# Patient Record
Sex: Female | Born: 1990 | Race: Black or African American | Hispanic: No | Marital: Single | Smoking: Never smoker
Health system: Southern US, Community
[De-identification: ages and names within clinical notes are randomized; demographics above are authoritative.]

## PROBLEM LIST (undated history)

## (undated) ENCOUNTER — Inpatient Hospital Stay (HOSPITAL_COMMUNITY): Payer: Self-pay

## (undated) DIAGNOSIS — K5190 Ulcerative colitis, unspecified, without complications: Secondary | ICD-10-CM

## (undated) DIAGNOSIS — O24419 Gestational diabetes mellitus in pregnancy, unspecified control: Secondary | ICD-10-CM

## (undated) HISTORY — PX: NO PAST SURGERIES: SHX2092

## (undated) HISTORY — DX: Gestational diabetes mellitus in pregnancy, unspecified control: O24.419

---
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
## 2010-05-13 LAB — POCT URINALYSIS DIPSTICK
Protein, ur: 30 mg/dL — AB
Urobilinogen, UA: 0.2 mg/dL (ref 0.0–1.0)

## 2010-05-13 LAB — POCT PREGNANCY, URINE: Preg Test, Ur: NEGATIVE

## 2010-11-08 ENCOUNTER — Emergency Department (HOSPITAL_COMMUNITY)
Admission: EM | Admit: 2010-11-08 | Discharge: 2010-11-08 | Disposition: A | Discharge status: Home / Self Care | Payer: BC Managed Care – PPO | Attending: Emergency Medicine | Admitting: Emergency Medicine

## 2010-11-08 ENCOUNTER — Emergency Department (HOSPITAL_COMMUNITY): Payer: BC Managed Care – PPO

## 2010-11-08 DIAGNOSIS — K5900 Constipation, unspecified: Secondary | ICD-10-CM | POA: Insufficient documentation

## 2010-11-08 DIAGNOSIS — R109 Unspecified abdominal pain: Secondary | ICD-10-CM | POA: Insufficient documentation

## 2010-11-08 DIAGNOSIS — N72 Inflammatory disease of cervix uteri: Secondary | ICD-10-CM | POA: Insufficient documentation

## 2010-11-08 DIAGNOSIS — B9689 Other specified bacterial agents as the cause of diseases classified elsewhere: Secondary | ICD-10-CM | POA: Insufficient documentation

## 2010-11-08 DIAGNOSIS — R112 Nausea with vomiting, unspecified: Secondary | ICD-10-CM | POA: Insufficient documentation

## 2010-11-08 DIAGNOSIS — N760 Acute vaginitis: Secondary | ICD-10-CM | POA: Insufficient documentation

## 2010-11-08 DIAGNOSIS — A499 Bacterial infection, unspecified: Secondary | ICD-10-CM | POA: Insufficient documentation

## 2010-11-08 LAB — DIFFERENTIAL
Basophils Absolute: 0 10*3/uL (ref 0.0–0.1)
Basophils Relative: 0 % (ref 0–1)
Eosinophils Absolute: 0.1 10*3/uL (ref 0.0–0.7)
Eosinophils Relative: 2 % (ref 0–5)

## 2010-11-08 LAB — URINALYSIS, ROUTINE W REFLEX MICROSCOPIC
Bilirubin Urine: NEGATIVE
Nitrite: NEGATIVE
Specific Gravity, Urine: 1.01 (ref 1.005–1.030)
Urobilinogen, UA: 0.2 mg/dL (ref 0.0–1.0)

## 2010-11-08 LAB — URINE MICROSCOPIC-ADD ON

## 2010-11-08 LAB — BASIC METABOLIC PANEL
Chloride: 104 mEq/L (ref 96–112)
Creatinine, Ser: 1.36 mg/dL — ABNORMAL HIGH (ref 0.50–1.10)
GFR calc Af Amer: 60 mL/min — ABNORMAL LOW (ref >60)
Potassium: 3.5 mEq/L (ref 3.5–5.1)

## 2010-11-08 LAB — CBC
Platelets: 248 10*3/uL (ref 150–400)
RDW: 13.2 % (ref 11.5–15.5)
WBC: 7.1 10*3/uL (ref 4.0–10.5)

## 2010-11-08 LAB — POCT PREGNANCY, URINE: Preg Test, Ur: NEGATIVE

## 2010-11-08 LAB — WET PREP, GENITAL: Trich, Wet Prep: NONE SEEN

## 2010-11-08 MED ORDER — IOHEXOL 300 MG/ML  SOLN
100.0000 mL | Freq: Once | INTRAMUSCULAR | Status: AC | PRN
  Administered 2010-11-08: 100 mL via INTRAVENOUS

## 2010-11-09 LAB — GC/CHLAMYDIA PROBE AMP, GENITAL
Chlamydia, DNA Probe: NEGATIVE
GC Probe Amp, Genital: NEGATIVE

## 2011-05-12 ENCOUNTER — Emergency Department (INDEPENDENT_AMBULATORY_CARE_PROVIDER_SITE_OTHER)
Admission: EM | Admit: 2011-05-12 | Discharge: 2011-05-12 | Disposition: A | Discharge status: Home / Self Care | Payer: BC Managed Care – PPO | Source: Home / Self Care | Attending: Emergency Medicine | Admitting: Emergency Medicine

## 2011-05-12 ENCOUNTER — Encounter (HOSPITAL_COMMUNITY): Payer: Self-pay | Admitting: *Deleted

## 2011-05-12 DIAGNOSIS — N938 Other specified abnormal uterine and vaginal bleeding: Secondary | ICD-10-CM

## 2011-05-12 DIAGNOSIS — N926 Irregular menstruation, unspecified: Secondary | ICD-10-CM

## 2011-05-12 DIAGNOSIS — N939 Abnormal uterine and vaginal bleeding, unspecified: Secondary | ICD-10-CM

## 2011-05-12 LAB — POCT URINALYSIS DIP (DEVICE)
Glucose, UA: NEGATIVE mg/dL
Nitrite: NEGATIVE
Protein, ur: 30 mg/dL — AB
Urobilinogen, UA: 1 mg/dL (ref 0.0–1.0)
pH: 6.5 (ref 5.0–8.0)

## 2011-05-12 MED ORDER — MEDROXYPROGESTERONE ACETATE 5 MG PO TABS: 5.0000 mg | ORAL_TABLET | Freq: Every day | ORAL | Status: DC

## 2011-05-12 MED ORDER — NORGESTIM-ETH ESTRAD TRIPHASIC 0.18/0.215/0.25 MG-35 MCG PO TABS: 1.0000 | ORAL_TABLET | Freq: Every day | ORAL | Status: DC

## 2011-05-12 MED ORDER — NAPROXEN 375 MG PO TABS: 375.0000 mg | ORAL_TABLET | Freq: Two times a day (BID) | ORAL | Status: DC

## 2011-05-12 NOTE — Discharge Instructions (Signed)
Abnormal Uterine Bleeding Abnormal uterine bleeding can have many causes. Some cases are simply treated, while others are more serious. There are several kinds of bleeding that is considered abnormal, including:  Bleeding between periods.   Bleeding after sexual intercourse.   Spotting anytime in the menstrual cycle.   Bleeding heavier or more than normal.   Bleeding after menopause.  CAUSES  There are many causes of abnormal uterine bleeding. It can be present in teenagers, pregnant women, women during their reproductive years, and women who have reached menopause. Your caregiver will look for the more common causes depending on your age, signs, symptoms and your particular circumstance. Most cases are not serious and can be treated. Even the more serious causes, like cancer of the female organs, can be treated adequately if found in the early stages. That is why all types of bleeding should be evaluated and treated as soon as possible. DIAGNOSIS  Diagnosing the cause may take several kinds of tests. Your caregiver may:  Take a complete history of the type of bleeding.   Perform a complete physical exam and Pap smear.   Take an ultrasound on the abdomen showing a picture of the female organs and the pelvis.   Inject dye into the uterus and Fallopian tubes and X-ray them (hysterosalpingogram).   Place fluid in the uterus and do an ultrasound (sonohysterogrqphy).   Take a CT scan to examine the female organs and pelvis.   Take an MRI to examine the female organs and pelvis. There is no X-ray involved with this procedure.   Look inside the uterus with a telescope that has a light at the end (hysteroscopy).   Scrap the inside of the uterus to get tissue to examine (Dilatation and Curettage, D&C).   Look into the pelvis with a telescope that has a light at the end (laparoscopy). This is done through a very small cut (incision) in the abdomen.  TREATMENT  Treatment will depend on the  cause of the abnormal bleeding. It can include:  Doing nothing to allow the problem to take care of itself over time.   Hormone treatment.   Birth control pills.   Treating the medical condition causing the problem.   Laparoscopy.   Major or minor surgery   Destroying the lining of the uterus with electrical currant, laser, freezing or heat (uterine ablation).  HOME CARE INSTRUCTIONS   Follow your caregiver's recommendation on how to treat your problem.   See your caregiver if you missed a menstrual period and think you may be pregnant.   If you are bleeding heavily, count the number of pads/tampons you use and how often you have to change them. Tell this to your caregiver.   Avoid sexual intercourse until the problem is controlled.  SEEK MEDICAL CARE IF:   You have any kind of abnormal bleeding mentioned above.   You feel dizzy at times.   You are 21 years old and have not had a menstrual period yet.  SEEK IMMEDIATE MEDICAL CARE IF:   You pass out.   You are changing pads/tampons every 15 to 30 minutes.   You have belly (abdominal) pain.   You have a temperature of 100 F (37.8 C) or higher.   You become sweaty or weak.   You are passing large blood clots from the vagina.   You start to feel sick to your stomach (nauseous) and throw up (vomit).  Document Released: 02/15/2005 Document Revised: 02/04/2011 Document Reviewed: 07/11/2008 [employer]   Patient Information 2012 [employer]. 

## 2011-05-12 NOTE — ED Provider Notes (Signed)
Chief Complaint  Patient presents with  . Vaginal Bleeding    History of Present Illness:   The patient is a 21 year old female who has had a two-week history of abnormal menses. She switched from Avianne birth control pills in January 2 a new prescription control pill called Leilani Merl. Her last menstrual period began on March 4 and it was a week or early. It lasted 4 days and was normal in amount of flow but with some cramping. She denies any passage of clots or tissue. She stopped from the eighth to the 11th then again started on the 11th, 2 days ago. She is still bleeding right now, but has less flow than a normal menstrual period. She does have more severe cramping. She denies any passage of clots or tissue. She has had no fever, chills, nausea, or vomiting, but does note some anorexia. She sees a gynecologist in [place]. Does not have a gynecologist here.  Review of Systems:  Other than noted above, the patient denies any of the following symptoms: Systemic:  No fever, chills, sweats, fatigue, or weight loss. GI:  No abdominal pain, nausea, anorexia, vomiting, diarrhea, constipation, melena or hematochezia. GU:  No dysuria, frequency, urgency, hematuria, vaginal discharge, itching, or abnormal vaginal bleeding. Skin:  No rash or itching.   PMFSH:  Past medical history, family history, social history, meds, and allergies were reviewed.  Physical Exam:   Vital signs:  BP 110/71  Pulse 68  Temp(Src) 98.3 F (36.8 C) (Oral)  Resp 18  SpO2 96%  LMP 05/07/2011 General:  Alert, oriented and in no distress. Lungs:  Breath sounds clear and equal bilaterally.  No wheezes, rales or rhonchi. Heart:  Regular rhythm.  No gallops or murmers. Abdomen:  Soft, flat and non-distended.  No organomegaly or mass.  No tenderness, guarding or rebound.  Bowel sounds normally active. Pelvic exam:  Normal external genitalia. There is a moderate amount of blood and clots in the vaginal vault. Cervix appears  normal and closed. There is no cervical motion tenderness. Uterus is mid position, normal in size and shape and nontender. She has mild bilateral adnexal tenderness but no mass. Skin:  Clear, warm and dry.  Labs:   Results for orders placed during the hospital encounter of 05/12/11  POCT URINALYSIS DIP (DEVICE)      Component Value Range   Glucose, UA NEGATIVE  NEGATIVE (mg/dL)   Bilirubin Urine SMALL (*) NEGATIVE    Ketones, ur >=160 (*) NEGATIVE (mg/dL)   Specific Gravity, Urine 1.025  1.005 - 1.030    Hgb urine dipstick LARGE (*) NEGATIVE    pH 6.5  5.0 - 8.0    Protein, ur 30 (*) NEGATIVE (mg/dL)   Urobilinogen, UA 1.0  0.0 - 1.0 (mg/dL)   Nitrite NEGATIVE  NEGATIVE    Leukocytes, UA NEGATIVE  NEGATIVE   POCT PREGNANCY, URINE      Component Value Range   Preg Test, Ur NEGATIVE  NEGATIVE      Assessment:   Diagnoses that have been ruled out:  None  Diagnoses that are still under consideration:  None  Final diagnoses:  Dysfunctional uterine bleeding - possibly due to change in her birth control pills.     Plan:   1.  The following meds were prescribed:   New Prescriptions   MEDROXYPROGESTERONE (PROVERA) 5 MG TABLET    Take 1 tablet (5 mg total) by mouth daily.   NAPROXEN (NAPROSYN) 375 MG TABLET    Take 1  tablet (375 mg total) by mouth 2 (two) times daily.   NORGESTIMATE-ETHINYL ESTRADIOL TRIPHASIC (TRI-SPRINTEC) 0.18/0.215/0.25 MG-35 MCG TABLET    Take 1 tablet by mouth daily.   2.  The patient was instructed in symptomatic care and handouts were given. 3.  The patient was told to return if becoming worse in any way, if no better in 3 or 4 days, and given some red flag symptoms that would indicate earlier return. 4.  I suggested that she take her current birth control pill along with the Provera for the next 10 days. Thereafter, she should wait till her current pack is done then start the tri-Sprintec. I gave her the name of a gynecologist to followup with  thereafter.    Reuben Likes, MD 05/12/11 2212

## 2011-05-12 NOTE — ED Notes (Signed)
Pt  Reports  Symptoms   Of  Vaginal  Bleeding    intermittant    Since  Last week       She  Also  Reports some    Low  abd  And  Back pain     As  Well  -  The  Pt  Is  Taking  BCP     The  Pt   Is  Ambulatory      In  An upright manner   -  She   Appears  In no  Severe  Distress      Her  Skin is  Warm  And  Dry

## 2011-06-03 ENCOUNTER — Encounter (HOSPITAL_COMMUNITY): Payer: Self-pay | Admitting: Emergency Medicine

## 2011-06-03 ENCOUNTER — Emergency Department (INDEPENDENT_AMBULATORY_CARE_PROVIDER_SITE_OTHER)
Admission: EM | Admit: 2011-06-03 | Discharge: 2011-06-03 | Disposition: A | Discharge status: Home / Self Care | Payer: BC Managed Care – PPO | Source: Home / Self Care | Attending: Family Medicine | Admitting: Family Medicine

## 2011-06-03 DIAGNOSIS — L259 Unspecified contact dermatitis, unspecified cause: Secondary | ICD-10-CM

## 2011-06-03 DIAGNOSIS — N760 Acute vaginitis: Secondary | ICD-10-CM

## 2011-06-03 DIAGNOSIS — N644 Mastodynia: Secondary | ICD-10-CM

## 2011-06-03 LAB — POCT URINALYSIS DIP (DEVICE)
Bilirubin Urine: NEGATIVE
Glucose, UA: NEGATIVE mg/dL
Ketones, ur: NEGATIVE mg/dL
Leukocytes, UA: NEGATIVE
Nitrite: NEGATIVE

## 2011-06-03 LAB — WET PREP, GENITAL: Clue Cells Wet Prep HPF POC: NONE SEEN

## 2011-06-03 LAB — POCT PREGNANCY, URINE: Preg Test, Ur: NEGATIVE

## 2011-06-03 MED ORDER — FLUTICASONE PROPIONATE 0.05 % EX CREA: TOPICAL_CREAM | Freq: Two times a day (BID) | CUTANEOUS | Status: DC

## 2011-06-03 NOTE — Discharge Instructions (Signed)
Use medicine as needed , we will call if tests show a need for other treatment.

## 2011-06-03 NOTE — ED Notes (Signed)
Pt here with vaginal irritation after using mixed body wash since Monday.pt denies vag d/c or pain.

## 2011-06-03 NOTE — ED Provider Notes (Signed)
History     CSN: [id number]  Arrival date & time 06/03/11  1234   First MD Initiated Contact with Patient 06/03/11 1339      Chief Complaint  Patient presents with  . Vaginitis    (Consider location/radiation/quality/duration/timing/severity/associated sxs/prior treatment) Patient is a 21 y.o. female presenting with rash. The history is provided by the patient.  Rash  This is a new problem. The current episode started more than 2 days ago. The problem has been gradually improving. Associated with: mixing 2 body wash soaps was claimed as etiol. There has been no fever. The rash is present on the genitalia. The patient is experiencing no pain. Associated symptoms include itching. Pertinent negatives include no blisters and no pain. Risk factors include new environmental exposures.    History reviewed. No pertinent past medical history.  History reviewed. No pertinent past surgical history.  No family history on file.  History  Substance Use Topics  . Smoking status: Not on file  . Smokeless tobacco: Not on file  . Alcohol Use: No    OB History    Grav Para Term Preterm Abortions TAB SAB Ect Mult Living                  Review of Systems  Constitutional: Negative.   Gastrointestinal: Negative.   Genitourinary: Negative.   Skin: Positive for itching and rash.    Allergies  Review of patient's allergies indicates no known allergies.  Home Medications   Current Outpatient Rx  Name Route Sig Dispense Refill  . FLUTICASONE PROPIONATE 0.05 % EX CREA Topical Apply topically 2 (two) times daily. 30 g 0  . MEDROXYPROGESTERONE ACETATE 5 MG PO TABS Oral Take 1 tablet (5 mg total) by mouth daily. 5 tablet 0  . NAPROXEN 375 MG PO TABS Oral Take 1 tablet (375 mg total) by mouth 2 (two) times daily. 20 tablet 0  . NORGESTIM-ETH ESTRAD TRIPHASIC 0.18/0.215/0.25 MG-35 MCG PO TABS Oral Take 1 tablet by mouth daily. 1 Package 11    BP 122/66  Pulse 74  Temp(Src) 97.2 F (36.2  C) (Oral)  Resp 16  SpO2 99%  LMP 05/07/2011  Physical Exam  Nursing note and vitals reviewed. Constitutional: She appears well-developed and well-nourished.  Abdominal: Soft. Bowel sounds are normal. There is no tenderness.  Genitourinary: Vagina normal and uterus normal. There is rash on the right labia. There is no tenderness or lesion on the right labia. There is rash on the left labia. There is no tenderness or lesion on the left labia. Cervix exhibits no motion tenderness and no discharge. Right adnexum displays no tenderness. Left adnexum displays no tenderness. No vaginal discharge found.  Skin: Rash noted.       Mild labial pruritis, hyperemia, no lesions.    ED Course  Procedures (including critical care time)  Labs Reviewed  POCT URINALYSIS DIP (DEVICE) - Abnormal; Notable for the following:    Hgb urine dipstick TRACE (*)    All other components within normal limits  POCT PREGNANCY, URINE  GC/CHLAMYDIA PROBE AMP, GENITAL  WET PREP, GENITAL   No results found.   1. Contact dermatitis   2. Vaginitis       MDM          Linna Hoff, MD 06/03/11 1427

## 2011-06-04 ENCOUNTER — Telehealth (HOSPITAL_COMMUNITY): Payer: Self-pay | Admitting: *Deleted

## 2011-06-04 LAB — GC/CHLAMYDIA PROBE AMP, GENITAL: GC Probe Amp, Genital: NEGATIVE

## 2011-06-04 MED ORDER — METRONIDAZOLE 250 MG PO TABS: 250.0000 mg | ORAL_TABLET | Freq: Three times a day (TID) | ORAL | Status: AC

## 2011-06-04 NOTE — ED Notes (Signed)
GC/Chlamydia neg., Wet prep: Many trich, many WBC's.  Labs shown to Dr. Artis Flock and he e-prescribed Flagyl to [facility] on [address] ( pt.'s preferred pharmacy). I called pt. Pt. verified x 2 and given results. Pt. instructed to notify her partner to be treated with Flagyl, no sex until you have finished your medication and your partner has been treated and to practice safe sex. You can get HIV testing at the [facility] clinic.  Pt. instructed to no alcohol while taking this medication.  Pt. told that we had e-prescribed the medicine to [facility].  She said she wants it at the [facility] on [address] because they take " her card" and it is closer. I told her she could transfer it. Pt. did not understand when I told her how to do it. I told her I would [facility] into [facility] and cancel the other Rx.  Rx. called to pharmacist @ [phone]. He said he would call [facility] and cancel the other order. Vassie Moselle 06/04/2011

## 2011-06-29 ENCOUNTER — Encounter (HOSPITAL_COMMUNITY): Payer: Self-pay | Admitting: Emergency Medicine

## 2011-06-29 ENCOUNTER — Emergency Department (INDEPENDENT_AMBULATORY_CARE_PROVIDER_SITE_OTHER)
Admission: EM | Admit: 2011-06-29 | Discharge: 2011-06-29 | Disposition: A | Discharge status: Home Health | Payer: BC Managed Care – PPO | Source: Home / Self Care

## 2011-06-29 DIAGNOSIS — N644 Mastodynia: Secondary | ICD-10-CM

## 2011-06-29 LAB — POCT PREGNANCY, URINE: Preg Test, Ur: NEGATIVE

## 2011-06-29 LAB — POCT URINALYSIS DIP (DEVICE)
Leukocytes, UA: NEGATIVE
Nitrite: NEGATIVE
Protein, ur: NEGATIVE mg/dL
Urobilinogen, UA: 0.2 mg/dL (ref 0.0–1.0)
pH: 6.5 (ref 5.0–8.0)

## 2011-06-29 NOTE — ED Notes (Addendum)
Pt here with bilat breast soreness more on the left side around the nipple.no d/c noted.sx started x 2 dys ago unrelieved by ibuprofen and abdominal achy pain that started this am but is getting better.denies n/v/f or diarrhea.

## 2011-06-29 NOTE — ED Notes (Signed)
WENT TO OBTAIN URINE SPECIMEN, PATIENT COULD NOT VOID.

## 2011-06-29 NOTE — Discharge Instructions (Signed)
Thank you for coming in today. I think your breast tenderness is coming from the changing hormone levels in the tri-sprintec.  It should pass over a few days.  Please take ibuprofen 2-3 pills every 6 hours a needed.  Follow up with your doctor in a few weeks if the symptoms continue.  I   Breast Tenderness Breast tenderness is a common complaint made by women of all ages. It is also called mastalgia or mastodynia, which means breast pain. The condition can range from mild discomfort to severe pain. It has a variety of causes. Your caregiver will find out the likely cause of your breast tenderness by examining your breasts, asking you about symptoms and perhaps ordering some tests. Breast tenderness usually does not mean you have breast cancer. CAUSES  Breast tenderness has many possible causes. They include:  Premenstrual changes. A week to 10 days before your period, your breasts might ache or feel tender.   Other hormonal causes. These include:   When sexual and physical traits mature (puberty).   Pregnancy.   The time right before and the year after menopause (perimenopause).   The day when it has been 12 months since your last period (menopause).   Large breasts.   Infection (also called mastitis).   Birth control pills.   Breastfeeding. Tenderness can occur if the breasts are overfull with milk or if a milk duct is blocked.   Injury.   Fibrocystic breast changes. This is not cancer (benign). It causes painful breasts that feel lumpy.   Fluid-filled sacs (cysts). Often cysts can be drained in your healthcare provider's office.   Fibroadenoma. This is a tumor that is not cancerous.   Medication side effects. Blood pressure drugs and diuretics (which increase urine flow) sometimes cause breast tenderness.   Previous breast surgery, such as a breast reduction.   Breast cancer. Cancer is rarely the reason breasts are tender. In most women, tenderness is caused by something  else.  DIAGNOSIS  Several methods can be used to find out why your breasts are tender. They include:  Visual inspection of the breasts.   Examination by hand.   Tests, such as:   Mammogram.   Ultrasound.   Biopsy.   Lab test of any fluid coming from the nipple.   Blood tests.   MRI.  TREATMENT  Treatment is directed to the cause of the breast tenderness from doing nothing for minor discomfort, wearing a good support bra but also may include:  Taking over-the-counter medicines for pain or discomfort as directed by your caregiver.   Prescription medicine for breast tenderness related to:   Premenstrual.   Fibrocystic.   Puberty.   Pregnancy.   Menopause.   Previous breast surgery.   Large breasts.   Antibiotics for infection.   Birth control pills for fibrocystic and premenstrual changes.   More frequent feedings or pumping of the breasts and warm compresses for breast engorgement when nursing.   Cold and warm compresses and a good support bra for most breast injuries.   Breast cysts are sometimes drained with a needle (aspiration) or removed with minor surgery.   Fibroadenomas are usually removed with minor surgery.   Changing or stopping the medicine when it is responsible for causing the breast tenderness.   When breast cancer is present with or without causing pain, it is usually treated with major surgery (with or without radiation) and chemotherapy.  HOME CARE INSTRUCTIONS  Breast tenderness often can be handled at  home. You can try:  Getting fitted for a new bra that provides more support, especially during exercise.   Wearing a more supportive or sports bra while sleeping when your breasts are very tender.   If you have a breast injury, using an ice pack for 15 to 20 minutes. Wrap the pack in a towel. Do not put the ice pack directly on your breast.   If your breasts are too full of milk as a result of breastfeeding, try:   Expressing milk  either by hand or with a breast pump.   Applying a warm compress for relief.   Taking over-the-counter pain relievers, if this is OK with your caregiver.   Taking medicine that your caregiver prescribes. These might include antibiotics or birth control pills.  Over the long term, your breast tenderness might be eased if you:  Cut down on caffeine.   Reduce the amount of fat in your diet.  Also, learn how to do breast examinations at home. This will help you tell when you have an unusual growth or lump that could cause tenderness. And keep a log of the days and times when your breasts are most tender. This will help you and your caregiver find the right solution. SEEK MEDICAL CARE IF:   Any part of your breast is hard, red and hot to the touch. This could be a sign of infection.   Fluid is coming out of your nipples (and you are not breastfeeding). Especially watch for blood or pus.   You have a fever as well as breast tenderness.   You have a new or painful lump in your breast that remains after your period ends.   You have tried to take care of the pain at home, but it has not gone away.   Your breast pain is getting worse. Or, the pain is making it hard to do the things you usually do during your day.  Document Released: 01/29/2008 Document Revised: 02/04/2011 Document Reviewed: 01/29/2008 [facility] Patient Information 2012 [place], [place].

## 2011-06-29 NOTE — ED Provider Notes (Signed)
Christina Young is a 21 y.o. female who presents to Urgent Care today for   1) bilateral breast pain starting 2-3 days ago.  Patient notes moderate bilateral breast pain left worse than right without any masses or nipple discharge. She has never had anything like this before. She has not tried any medicines yet for it. She does note that recently she changed from one type of birth control pill to another. This happened about 2 weeks ago. She is currently taking tri-Sprintec.   She's not had a period since mid-March because of the birth control pill change.  2) abdominal pain. Starting today associated with nausea but not vomiting. Mild and bilateral lower. Not associated with dysuria discharge or vaginal bleeding. Is able to eat and drink. No vomiting or diarrhea. The pain is improving since this morning. She is able to eat and drink normally   PMH reviewed. Otherwise healthy young woman. Nonsmoker. ROS as above otherwise neg.   Medications reviewed. No current facility-administered medications for this encounter.   Current Outpatient Prescriptions  Medication Sig Dispense Refill  . fluticasone (CUTIVATE) 0.05 % cream Apply topically 2 (two) times daily.  30 g  0  . medroxyPROGESTERone (PROVERA) 5 MG tablet Take 1 tablet (5 mg total) by mouth daily.  5 tablet  0  . naproxen (NAPROSYN) 375 MG tablet Take 1 tablet (375 mg total) by mouth 2 (two) times daily.  20 tablet  0  . Norgestimate-Ethinyl Estradiol Triphasic (TRI-SPRINTEC) 0.18/0.215/0.25 MG-35 MCG tablet Take 1 tablet by mouth daily.  1 Package  11    Exam:  BP 117/70  Pulse 65  Temp(Src) 98.7 F (37.1 C) (Oral)  Resp 16  SpO2 100%  LMP 06/14/2011 Gen: Well NAD HEENT: EOMI,  MMM Lungs: CTABL Nl WOB Heart: RRR no MRG Breasts: Symmetric without any skin findings.  No masses noted. Tender to palpation under the nipples bilaterally.   Abd: NABS, , ND no masses noted.  Mildly tender in the bilateral lower quadrants. No rebound or  guarding. Exts: Non edematous BL  LE, warm and well perfused.  GYN: Normal external genitalia. No cervical motion tenderness. Adnexa are free of mass or tenderness bilaterally.  Results for orders placed during the hospital encounter of 06/29/11 (from the past 24 hour(s))  POCT URINALYSIS DIP (DEVICE)     Status: Abnormal   Collection Time   06/29/11  6:07 PM      Component Value Range   Glucose, UA NEGATIVE  NEGATIVE (mg/dL)   Bilirubin Urine NEGATIVE  NEGATIVE    Ketones, ur NEGATIVE  NEGATIVE (mg/dL)   Specific Gravity, Urine 1.025  1.005 - 1.030    Hgb urine dipstick SMALL (*) NEGATIVE    pH 6.5  5.0 - 8.0    Protein, ur NEGATIVE  NEGATIVE (mg/dL)   Urobilinogen, UA 0.2  0.0 - 1.0 (mg/dL)   Nitrite NEGATIVE  NEGATIVE    Leukocytes, UA NEGATIVE  NEGATIVE   POCT PREGNANCY, URINE     Status: Normal   Collection Time   06/29/11  6:08 PM      Component Value Range   Preg Test, Ur NEGATIVE  NEGATIVE    No results found.  Assessment and Plan: 21 y.o. female with  1) breast pain: I think this is reactive breast changes secondary to changing hormones with the new birth control pill.  She is not pregnant and does not have any other worrisome breast findings or large ovarian tumor to bimanual exam. I  advised continuing hormonal birth control pills and using ibuprofen or Tylenol for pain. Additionally advised following up with primary care doctor in 2 weeks if still symptomatic. 2) abdominal pain: Nonspecific and improving. Exam shows no masses moderate tenderness rebound or guarding. Additionally this may be due to the birth control pills. She may have to high the dose of estrogen for her weight.  This also may be a transient viral gastroenteritis.  Advised watchful waiting and followup if not improved. Discussed warning signs or symptoms such as worsening pain vomiting and not improving. She expresses understanding.     Rodolph Bong, MD 06/29/11 [phone]

## 2011-06-30 NOTE — ED Provider Notes (Signed)
Medical screening examination/treatment/procedure(s) were performed by PGY-3 FM resident and as supervising physician I was immediately available for consultation/collaboration.   Sharin Grave, MD   Sharin Grave, MD 06/30/11 [phone]

## 2011-08-01 ENCOUNTER — Encounter (HOSPITAL_COMMUNITY): Payer: Self-pay | Admitting: Emergency Medicine

## 2011-08-01 ENCOUNTER — Emergency Department (HOSPITAL_COMMUNITY)
Admission: EM | Admit: 2011-08-01 | Discharge: 2011-08-02 | Disposition: A | Discharge status: Home / Self Care | Payer: BC Managed Care – PPO | Attending: Emergency Medicine | Admitting: Emergency Medicine

## 2011-08-01 DIAGNOSIS — R5381 Other malaise: Secondary | ICD-10-CM | POA: Insufficient documentation

## 2011-08-01 DIAGNOSIS — R5383 Other fatigue: Secondary | ICD-10-CM

## 2011-08-01 NOTE — ED Notes (Signed)
Pt alert, nad, states seen PCP beginning of may, states low thyroid function, present to ED with fatigue, low appetite, resp even unlabored, skin pwd

## 2011-08-02 LAB — POCT I-STAT, CHEM 8
Creatinine, Ser: 0.9 mg/dL (ref 0.50–1.10)
HCT: 39 % (ref 36.0–46.0)
Hemoglobin: 13.3 g/dL (ref 12.0–15.0)
Sodium: 140 mEq/L (ref 135–145)
TCO2: 25 mmol/L (ref 0–100)

## 2011-08-02 LAB — CBC
Hemoglobin: 13.3 g/dL (ref 12.0–15.0)
MCH: 28.2 pg (ref 26.0–34.0)
MCHC: 33.4 g/dL (ref 30.0–36.0)
Platelets: 269 10*3/uL (ref 150–400)
RDW: 13 % (ref 11.5–15.5)

## 2011-08-02 LAB — DIFFERENTIAL
Basophils Absolute: 0 10*3/uL (ref 0.0–0.1)
Basophils Relative: 0 % (ref 0–1)
Eosinophils Absolute: 0.2 10*3/uL (ref 0.0–0.7)
Monocytes Relative: 8 % (ref 3–12)
Neutro Abs: 4.2 10*3/uL (ref 1.7–7.7)
Neutrophils Relative %: 44 % (ref 43–77)

## 2011-08-02 NOTE — ED Provider Notes (Signed)
Medical screening examination/treatment/procedure(s) were performed by non-physician practitioner and as supervising physician I was immediately available for consultation/collaboration. Devoria Albe, MD, FACEP   Ward Givens, MD 08/02/11 [phone]

## 2011-08-02 NOTE — ED Provider Notes (Signed)
History     CSN: [id number]  Arrival date & time 08/01/11  2032   First MD Initiated Contact with Patient 08/01/11 2240      Chief Complaint  Patient presents with  . Fatigue    (Consider location/radiation/quality/duration/timing/severity/associated sxs/prior treatment) HPI  Patient presents to ER complaining of a one to two month hx of fatigue, loss of appetite, and weight loss stating that she saw her PCP in [place] at the beginning of May and told that she had an abnormal thyroid but states "I was not started on any medicines. 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 Patient states she has been having ongoing unchanging symptoms. She has no other known medical problems and takes only oral birth control on regular basis. Denies fevers, chills, HA, dizziness, CP, SOB, abdominal pain, n/v/d, dysuria, hematuria, blood in stool. Denies aggravating or alleviating factors.   History reviewed. No pertinent past medical history.  History reviewed. No pertinent past surgical history.  No family history on file.  History  Substance Use Topics  . Smoking status: Not on file  . Smokeless tobacco: Not on file  . Alcohol Use: No    OB History    Grav Para Term Preterm Abortions TAB SAB Ect Mult Living                  Review of Systems  All other systems reviewed and are negative.    Allergies  Review of patient's allergies indicates no known allergies.  Home Medications   Current Outpatient Rx  Name Route Sig Dispense Refill  . LEVONORGESTREL-ETHINYL ESTRAD 0.1-20 MG-MCG PO TABS Oral Take 1 tablet by mouth daily.      BP 124/74  Pulse 66  Temp(Src) 98.3 F (36.8 C) (Oral)  Resp 16  Ht 5\' 6"  (1.676 m)  Wt 123 lb 3.2 oz (55.883 kg)  BMI 19.88 kg/m2  SpO2 100%  LMP 07/04/2011  Physical Exam    Nursing note and vitals reviewed. Constitutional: She is oriented to person, place, and time. She appears well-developed and well-nourished. No distress.  HENT:  Head: Normocephalic and atraumatic.  Eyes: Conjunctivae are normal.  Neck: Normal range of motion. Neck supple. No thyromegaly present.  Cardiovascular: Normal rate, regular rhythm, normal heart sounds and intact distal pulses.  Exam reveals no gallop and no friction rub.   No murmur heard. Pulmonary/Chest: Effort normal and breath sounds normal. No respiratory distress. She has no wheezes. She has no rales. She exhibits no tenderness.  Abdominal: Soft. Bowel sounds are normal. She exhibits no distension and no mass. There is no tenderness. There is no rebound and no guarding.  Musculoskeletal: Normal range of motion. She exhibits no edema and no tenderness.  Neurological: She is alert and oriented to person, place, and time.  Skin: Skin is warm and dry. No rash noted. She is not diaphoretic. No erythema.  Psychiatric: She has a normal mood and affect.    ED Course  Procedures (including critical care time)  Labs Reviewed  DIFFERENTIAL - Abnormal; Notable for the following:    Lymphs Abs 4.3 (*)    All other components within normal limits  CBC  POCT PREGNANCY, URINE  POCT I-STAT, CHEM 8   No results found.   1. Fatigue  MDM  Normal stable vital signs with patient afebrile and nontoxic-appearing. No acute lab findings in ER. Patient is ambulating without difficulty with basic complaints of fatigue, loss of appetite, and weight loss however she is being followed closely by primary care provider for abnormal thyroid and will be scheduled to see her primary care provider in 1-2 weeks.        [place], [place] 08/02/11 [phone]

## 2011-08-02 NOTE — Discharge Instructions (Signed)
It is very important to followup closely with your primary care Dr. to discuss her thyroid studies in your ongoing symptoms of fatigue. Return to the closest ER for emergent changing or worsening of symptoms.   Fatigue Fatigue is a feeling of tiredness, lack of energy, lack of motivation, or feeling tired all the time. Having enough rest, good nutrition, and reducing stress will normally reduce fatigue. Consult your caregiver if it persists. The nature of your fatigue will help your caregiver to find out its cause. The treatment is based on the cause.  CAUSES  There are many causes for fatigue. Most of the time, fatigue can be traced to one or more of your habits or routines. Most causes fit into one or more of three general areas. They are: Lifestyle problems  Sleep disturbances.   Overwork.   Physical exertion.   Unhealthy habits.   Poor eating habits or eating disorders.   Alcohol and/or drug use .   Lack of proper nutrition (malnutrition).  Psychological problems  Stress and/or anxiety problems.   Depression.   Grief.   Boredom.  Medical Problems or Conditions  Anemia.   Pregnancy.   Thyroid gland problems.   Recovery from major surgery.   Continuous pain.   Emphysema or asthma that is not well controlled   Allergic conditions.   Diabetes.   Infections (such as mononucleosis).   Obesity.   Sleep disorders, such as sleep apnea.   Heart failure or other heart-related problems.   Cancer.   Kidney disease.   Liver disease.   Effects of certain medicines such as antihistamines, cough and cold remedies, prescription pain medicines, heart and blood pressure medicines, drugs used for treatment of cancer, and some antidepressants.  SYMPTOMS  The symptoms of fatigue include:   Lack of energy.   Lack of drive (motivation).   Drowsiness.   Feeling of indifference to the surroundings.  DIAGNOSIS  The details of how you feel help guide your caregiver in  finding out what is causing the fatigue. You will be asked about your present and past health condition. It is important to review all medicines that you take, including prescription and non-prescription items. A thorough exam will be done. You will be questioned about your feelings, habits, and normal lifestyle. Your caregiver may suggest blood tests, urine tests, or other tests to look for common medical causes of fatigue.  TREATMENT  Fatigue is treated by correcting the underlying cause. For example, if you have continuous pain or depression, treating these causes will improve how you feel. Similarly, adjusting the dose of certain medicines will help in reducing fatigue.  HOME CARE INSTRUCTIONS   Try to get the required amount of good sleep every night.   Eat a healthy and nutritious diet, and drink enough water throughout the day.   Practice ways of relaxing (including yoga or meditation).   Exercise regularly.   Make plans to change situations that cause stress. Act on those plans so that stresses decrease over time. Keep your work and personal routine reasonable.   Avoid street drugs and minimize use of alcohol.   Start taking a daily multivitamin after consulting your caregiver.  SEEK MEDICAL CARE IF:   You have persistent tiredness, which cannot be accounted for.   You have fever.   You have unintentional weight loss.   You have headaches.   You have disturbed sleep throughout the night.   You are feeling sad.   You have constipation.  You have dry skin.   You have gained weight.   You are taking any new or different medicines that you suspect are causing fatigue.   You are unable to sleep at night.   You develop any unusual swelling of your legs or other parts of your body.  SEEK IMMEDIATE MEDICAL CARE IF:   You are feeling confused.   Your vision is blurred.   You feel faint or pass out.   You develop severe headache.   You develop severe abdominal,  pelvic, or back pain.   You develop chest pain, shortness of breath, or an irregular or fast heartbeat.   You are unable to pass a normal amount of urine.   You develop abnormal bleeding such as bleeding from the rectum or you vomit blood.   You have thoughts about harming yourself or committing suicide.   You are worried that you might harm someone else.  MAKE SURE YOU:   Understand these instructions.   Will watch your condition.   Will get help right away if you are not doing well or get worse.  Document Released: 12/13/2006 Document Revised: 02/04/2011 Document Reviewed: 12/13/2006 [employer] Patient Information 2012 [place], [place].

## 2011-09-08 ENCOUNTER — Encounter (HOSPITAL_COMMUNITY): Payer: Self-pay | Admitting: Emergency Medicine

## 2011-09-08 ENCOUNTER — Emergency Department (HOSPITAL_COMMUNITY)
Admission: EM | Admit: 2011-09-08 | Discharge: 2011-09-08 | Disposition: A | Discharge status: Home / Self Care | Payer: BC Managed Care – PPO | Source: Home / Self Care | Attending: Emergency Medicine | Admitting: Emergency Medicine

## 2011-09-08 DIAGNOSIS — N898 Other specified noninflammatory disorders of vagina: Secondary | ICD-10-CM

## 2011-09-08 LAB — POCT URINALYSIS DIP (DEVICE)
Bilirubin Urine: NEGATIVE
Nitrite: NEGATIVE
Protein, ur: NEGATIVE mg/dL
pH: 6.5 (ref 5.0–8.0)

## 2011-09-08 LAB — WET PREP, GENITAL

## 2011-09-08 LAB — POCT PREGNANCY, URINE: Preg Test, Ur: NEGATIVE

## 2011-09-08 MED ORDER — METRONIDAZOLE 500 MG PO TABS: 500.0000 mg | ORAL_TABLET | Freq: Two times a day (BID) | ORAL | Status: AC

## 2011-09-08 NOTE — ED Provider Notes (Signed)
History     CSN: [id number]  Arrival date & time 09/08/11  1100   First MD Initiated Contact with Patient 09/08/11 1113      Chief Complaint  Patient presents with  . Vaginal Discharge    (Consider location/radiation/quality/duration/timing/severity/associated sxs/prior treatment) HPI Comments: Patient presents with sudden onset of vaginal discharge and irritation with somewhat of an odor. It has been taking mesalamine for a undiagnosed colitis condition where she will followup with a gastroenterologist soon for further discussions of her workup. Patient denies any diarrheas or abdominal pain or fevers at this point. Patient also denies any urinary symptoms.  Patient is a 21 y.o. female presenting with vaginal discharge. The history is provided by the patient.  Vaginal Discharge This is a new problem. The current episode started more than 1 week ago. The problem has not changed since onset.Pertinent negatives include no abdominal pain, no headaches and no shortness of breath. Nothing aggravates the symptoms.    History reviewed. No pertinent past medical history.  History reviewed. No pertinent past surgical history.  No family history on file.  History  Substance Use Topics  . Smoking status: Never Smoker   . Smokeless tobacco: Not on file  . Alcohol Use: No    OB History    Grav Para Term Preterm Abortions TAB SAB Ect Mult Living                  Review of Systems  Constitutional: Negative for fever, chills and fatigue.  Respiratory: Negative for shortness of breath.   Gastrointestinal: Negative for abdominal pain.  Genitourinary: Positive for vaginal discharge. Negative for dysuria, frequency and flank pain.  Skin: Negative for rash and wound.  Neurological: Negative for headaches.    Allergies  Review of patient's allergies indicates no known allergies.  Home Medications   Current Outpatient Rx  Name Route Sig Dispense Refill  . MESALAMINE ER 0.375 G PO  CP24 Oral Take 375 mg by mouth daily.    Marland Kitchen OMEPRAZOLE 40 MG PO CPDR Oral Take 40 mg by mouth daily.    Marland Kitchen LEVONORGESTREL-ETHINYL ESTRAD 0.1-20 MG-MCG PO TABS Oral Take 1 tablet by mouth daily.    Marland Kitchen METRONIDAZOLE 500 MG PO TABS Oral Take 1 tablet (500 mg total) by mouth 2 (two) times daily. 14 tablet 0    BP 115/65  Pulse 64  Temp 98.3 F (36.8 C) (Oral)  Resp 16  SpO2 100%  LMP 08/30/2011  Physical Exam  Nursing note and vitals reviewed. Constitutional: Vital signs are normal. She appears well-developed and well-nourished.  Non-toxic appearance. She does not have a sickly appearance. She does not appear ill. No distress.  HENT:  Head: Normocephalic.  Eyes: Conjunctivae are normal.  Neck: Neck supple.  Abdominal: She exhibits no distension.  Genitourinary: Cervix exhibits no discharge and no friability. No erythema around the vagina.  Skin: Skin is warm. No erythema.    ED Course  Procedures (including critical care time)  Labs Reviewed  POCT URINALYSIS DIP (DEVICE) - Abnormal; Notable for the following:    Hgb urine dipstick TRACE (*)     Leukocytes, UA TRACE (*)  Biochemical Testing Only. Please order routine urinalysis from main lab if confirmatory testing is needed.   All other components within normal limits  POCT PREGNANCY, URINE  WET PREP, GENITAL  GC/CHLAMYDIA PROBE AMP, GENITAL   No results found.   1. Vaginal Discharge       MDM   Vaginal discharge. Samples  obtained for wet prep and DNA probes for Chlamydia and gonorrhea screening. Patient has been empirically treated for bacterial vaginosis. Patient agrees with treatment plan and followup care as necessary we will call if abnormal test results will require further treatment       Jimmie Molly, MD 09/08/11 1153

## 2011-09-08 NOTE — ED Notes (Signed)
Patient undressed and equipment at bedside

## 2011-09-08 NOTE — ED Notes (Signed)
Onset July 9 of vaginal irritation, discharge, itchiness

## 2011-09-09 ENCOUNTER — Telehealth (HOSPITAL_COMMUNITY): Payer: Self-pay | Admitting: *Deleted

## 2011-09-09 LAB — GC/CHLAMYDIA PROBE AMP, GENITAL: Chlamydia, DNA Probe: NEGATIVE

## 2011-09-09 NOTE — ED Notes (Signed)
GC/Chlamydia neg., Wet prep: Trich TNTC, WBC's TNTC.  Pt. adequately treated with Flagyl.  I called pt.  Pt. verified x 2 and given results. Pt. told she was adequately treated with the Flagyl and to finish of her medication. Pt. instructed to notify herr partner to be treated with Flagyl, no sex until you have finished your medication and your partner has been treated and to practice safe sex. You can get HIV testing at the [facility] STD clinic.  Pt. Voiced understanding. Vassie Moselle 09/09/2011

## 2011-10-09 ENCOUNTER — Encounter (HOSPITAL_COMMUNITY): Payer: Self-pay | Admitting: *Deleted

## 2011-10-09 ENCOUNTER — Emergency Department (HOSPITAL_COMMUNITY)
Admission: EM | Admit: 2011-10-09 | Discharge: 2011-10-09 | Disposition: A | Discharge status: Home / Self Care | Payer: BC Managed Care – PPO | Attending: Emergency Medicine | Admitting: Emergency Medicine

## 2011-10-09 DIAGNOSIS — R109 Unspecified abdominal pain: Secondary | ICD-10-CM

## 2011-10-09 HISTORY — DX: Ulcerative colitis, unspecified, without complications: K51.90

## 2011-10-09 LAB — CBC WITH DIFFERENTIAL/PLATELET
HCT: 39.8 % (ref 36.0–46.0)
Hemoglobin: 13.2 g/dL (ref 12.0–15.0)
Lymphocytes Relative: 41 % (ref 12–46)
Monocytes Absolute: 0.7 10*3/uL (ref 0.1–1.0)
Monocytes Relative: 10 % (ref 3–12)
Neutro Abs: 3.5 10*3/uL (ref 1.7–7.7)
WBC: 7.3 10*3/uL (ref 4.0–10.5)

## 2011-10-09 LAB — BASIC METABOLIC PANEL
CO2: 26 mEq/L (ref 19–32)
Chloride: 105 mEq/L (ref 96–112)
Creatinine, Ser: 0.73 mg/dL (ref 0.50–1.10)
GFR calc Af Amer: >90 mL/min (ref >90)
Sodium: 140 mEq/L (ref 135–145)

## 2011-10-09 LAB — URINALYSIS, ROUTINE W REFLEX MICROSCOPIC
Glucose, UA: NEGATIVE mg/dL
Ketones, ur: NEGATIVE mg/dL
Leukocytes, UA: NEGATIVE
Nitrite: NEGATIVE
Protein, ur: NEGATIVE mg/dL
pH: 6 (ref 5.0–8.0)

## 2011-10-09 LAB — OCCULT BLOOD X 1 CARD TO LAB, STOOL: Fecal Occult Bld: NEGATIVE

## 2011-10-09 LAB — URINE MICROSCOPIC-ADD ON

## 2011-10-09 LAB — PREGNANCY, URINE: Preg Test, Ur: NEGATIVE

## 2011-10-09 MED ORDER — SODIUM CHLORIDE 0.9 % IV BOLUS (SEPSIS): 1000.0000 mL | Freq: Once | INTRAVENOUS | Status: DC

## 2011-10-09 MED ORDER — TRAMADOL HCL 50 MG PO TABS: 50.0000 mg | ORAL_TABLET | Freq: Four times a day (QID) | ORAL | Status: AC | PRN

## 2011-10-09 MED ORDER — ONDANSETRON HCL 4 MG PO TABS: 4.0000 mg | ORAL_TABLET | Freq: Four times a day (QID) | ORAL | Status: AC

## 2011-10-09 MED ORDER — OXYCODONE-ACETAMINOPHEN 5-325 MG PO TABS
1.0000 | ORAL_TABLET | Freq: Once | ORAL | Status: AC
  Administered 2011-10-09: 1 via ORAL
  Filled 2011-10-09: qty 1

## 2011-10-09 MED ORDER — ONDANSETRON HCL 4 MG/2ML IJ SOLN
4.0000 mg | Freq: Once | INTRAMUSCULAR | Status: DC
  Filled 2011-10-09: qty 2

## 2011-10-09 MED ORDER — ONDANSETRON 4 MG PO TBDP
4.0000 mg | ORAL_TABLET | Freq: Once | ORAL | Status: AC
  Administered 2011-10-09: 4 mg via ORAL
  Filled 2011-10-09: qty 1

## 2011-10-09 MED ORDER — MORPHINE SULFATE 4 MG/ML IJ SOLN
4.0000 mg | Freq: Once | INTRAMUSCULAR | Status: DC
  Filled 2011-10-09: qty 1

## 2011-10-09 NOTE — ED Provider Notes (Signed)
History     CSN: [id number]  Arrival date & time 10/09/11  7829   First MD Initiated Contact with Patient 10/09/11 [phone]      Chief Complaint  Patient presents with  . Abdominal Pain    (Consider location/radiation/quality/duration/timing/severity/associated sxs/prior treatment) HPI  21 year old female with history of ulcerative colitis presents complaining of abdominal pain. Patient reports gradual onset of abdominal pain since yesterday. States she woke up with tenderness to her abdomen. sts "pain all over my stomach". Described as a sharp and aching sensation, constant, worsened when she ate some pizza. Patient reports one bout of vomiting prior to coming to the ED today. She denies fever, chills, chest pain, shortness of breath, rectal pain, rectal bleeding, urinary symptoms. She is currently on her menstruation. She reports her pain feels similar to her ulcerated colitis which she was diagnosed 3 months ago. She is currently taking mesalamine for her UC. Patient denies diarrhea or constipation.  Past Medical History  Diagnosis Date  . Ulcerative colitis     History reviewed. No pertinent past surgical history.  History reviewed. No pertinent family history.  History  Substance Use Topics  . Smoking status: Never Smoker   . Smokeless tobacco: Not on file  . Alcohol Use: No    OB History    Grav Para Term Preterm Abortions TAB SAB Ect Mult Living                  Review of Systems  All other systems reviewed and are negative.    Allergies  Review of patient's allergies indicates no known allergies.  Home Medications   Current Outpatient Rx  Name Route Sig Dispense Refill  . CHLORDIAZEPOXIDE HCL 10 MG PO CAPS Oral Take by mouth 3 (three) times daily as needed.    Marland Kitchen LEVONORGESTREL-ETHINYL ESTRAD 0.1-20 MG-MCG PO TABS Oral Take 1 tablet by mouth daily.    Marland Kitchen MESALAMINE ER 0.375 G PO CP24 Oral Take 375 mg by mouth daily.      BP 101/52  Pulse 78  Temp 98.4 F  (36.9 C) (Oral)  Resp 16  SpO2 100%  LMP 10/07/2011  Physical Exam  Nursing note and vitals reviewed. Constitutional: She is oriented to person, place, and time. She appears well-developed and well-nourished. No distress.       Awake, alert, nontoxic appearance  HENT:  Head: Atraumatic.  Mouth/Throat: Oropharynx is clear and moist.  Eyes: Conjunctivae are normal. Right eye exhibits no discharge. Left eye exhibits no discharge.  Neck: Neck supple.  Cardiovascular: Normal rate and regular rhythm.   Pulmonary/Chest: Effort normal. No respiratory distress. She exhibits no tenderness.  Abdominal: Soft. There is tenderness. There is no rebound.       Abdomen nondistended. Mild tenderness to palpation throughout without any focal point tenderness. No guarding, no rebound tenderness. No overlying skin changes.  Genitourinary: Rectum normal. Rectal exam shows no external hemorrhoid, no internal hemorrhoid, no fissure, no tenderness and anal tone normal.       Chaperone present  Musculoskeletal: Normal range of motion. She exhibits no edema and no tenderness.       ROM appears intact, no obvious focal weakness  Neurological: She is alert and oriented to person, place, and time.       Mental status and motor strength appears intact  Skin: Skin is warm. No rash noted.  Psychiatric: She has a normal mood and affect.    ED Course  Procedures (including critical care time)  Labs Reviewed - No data to display No results found.   No diagnosis found.  Results for orders placed during the hospital encounter of 10/09/11  CBC WITH DIFFERENTIAL      Component Value Range   WBC 7.3  4.0 - 10.5 K/uL   RBC 4.70  3.87 - 5.11 MIL/uL   Hemoglobin 13.2  12.0 - 15.0 g/dL   HCT 40.9  81.1 - 91.4 %   MCV 84.7  78.0 - 100.0 fL   MCH 28.1  26.0 - 34.0 pg   MCHC 33.2  30.0 - 36.0 g/dL   RDW 78.2  95.6 - 21.3 %   Platelets 288  150 - 400 K/uL   Neutrophils Relative 48  43 - 77 %   Neutro Abs 3.5  1.7  - 7.7 K/uL   Lymphocytes Relative 41  12 - 46 %   Lymphs Abs 3.0  0.7 - 4.0 K/uL   Monocytes Relative 10  3 - 12 %   Monocytes Absolute 0.7  0.1 - 1.0 K/uL   Eosinophils Relative 2  0 - 5 %   Eosinophils Absolute 0.1  0.0 - 0.7 K/uL   Basophils Relative 0  0 - 1 %   Basophils Absolute 0.0  0.0 - 0.1 K/uL  BASIC METABOLIC PANEL      Component Value Range   Sodium 140  135 - 145 mEq/L   Potassium 3.6  3.5 - 5.1 mEq/L   Chloride 105  96 - 112 mEq/L   CO2 26  19 - 32 mEq/L   Glucose, Bld 90  70 - 99 mg/dL   BUN 13  6 - 23 mg/dL   Creatinine, Ser 0.86  0.50 - 1.10 mg/dL   Calcium 9.6  8.4 - 57.8 mg/dL   GFR calc non Af Amer >90  >90 mL/min   GFR calc Af Amer >90  >90 mL/min  URINALYSIS, ROUTINE W REFLEX MICROSCOPIC      Component Value Range   Color, Urine YELLOW  YELLOW   APPearance CLEAR  CLEAR   Specific Gravity, Urine 1.015  1.005 - 1.030   pH 6.0  5.0 - 8.0   Glucose, UA NEGATIVE  NEGATIVE mg/dL   Hgb urine dipstick LARGE (*) NEGATIVE   Bilirubin Urine NEGATIVE  NEGATIVE   Ketones, ur NEGATIVE  NEGATIVE mg/dL   Protein, ur NEGATIVE  NEGATIVE mg/dL   Urobilinogen, UA 0.2  0.0 - 1.0 mg/dL   Nitrite NEGATIVE  NEGATIVE   Leukocytes, UA NEGATIVE  NEGATIVE  PREGNANCY, URINE      Component Value Range   Preg Test, Ur NEGATIVE  NEGATIVE  OCCULT BLOOD X 1 CARD TO LAB, STOOL      Component Value Range   Fecal Occult Bld NEGATIVE    URINE MICROSCOPIC-ADD ON      Component Value Range   Squamous Epithelial / LPF RARE  RARE   No results found.  1. Abdominal pain  MDM  Pt with abd pain x 2 days that felt similar to her UC.  Pt has nonsurgical abdomen. Rectal exam unremarkable.  Appears nontoxic.  Afebrile and VSS.     5:39 AM Pt is currently on her menstruation, as evidence of large HGB in UA.  Otherwise no signs of UTI or kidney stones.  The remainder of her work up is unremarkable.  Pt able to tolerates PO.  Pt were given pain medication and antinausea medication.  Pt will  be discharge.  Family member  at bedside.  They agrees to bring pt to see her GI doctor in [place] next week for further care.  Strict return precaution discussed.    BP 101/52  Pulse 78  Temp 98.4 F (36.9 C) (Oral)  Resp 16  SpO2 100%  LMP 10/07/2011  Vital signs and medical records were reviewed and considered.  Labs  were reviewed by me.   Fayrene Helper, PA-C 10/09/11 0600

## 2011-10-10 NOTE — ED Provider Notes (Signed)
Medical screening examination/treatment/procedure(s) were performed by non-physician practitioner and as supervising physician I was immediately available for consultation/collaboration.  Raeford Razor, MD 10/10/11 [phone]

## 2011-10-18 ENCOUNTER — Emergency Department (HOSPITAL_COMMUNITY)
Admission: EM | Admit: 2011-10-18 | Discharge: 2011-10-18 | Disposition: A | Discharge status: Home / Self Care | Payer: BC Managed Care – PPO | Source: Home / Self Care | Attending: Emergency Medicine | Admitting: Emergency Medicine

## 2011-10-18 ENCOUNTER — Encounter (HOSPITAL_COMMUNITY): Payer: Self-pay

## 2011-10-18 DIAGNOSIS — A5909 Other urogenital trichomoniasis: Secondary | ICD-10-CM

## 2011-10-18 MED ORDER — METRONIDAZOLE 500 MG PO TABS: ORAL_TABLET | ORAL | Status: DC

## 2011-10-18 NOTE — ED Provider Notes (Signed)
History     CSN: [id number]  Arrival date & time 10/18/11  1100\ [facility] in [place] [facility] in [place]   First MD Initiated Contact with Patient 10/18/11 1101      Chief Complaint  Patient presents with  . Vaginal Discharge   HPI 21 yr old [facility] that started yesterday.  Her last Period was 10/04/11.  Has regular periods.  Menarche 14.    Sexually active with 1 partner for the past 2 years.  Rx with Flagyll in 7/13 for Trich and completed Rx.  Takes Avianne for BC-since age of 68.  NO recent Abx-Takes Apriso regularily-takes it x 4 a day.  Taking this since the past 07/2011.  Stomach pain + in the lower abdomen more consistent with her UC.  Meeting with her GI in [place] and will be referred to [facility] soon.clear whithhs discharge wioth no odour, no blood   Past Medical History  Diagnosis Date  . Ulcerative colitis     No past surgical history on file.  No family history on file.  History  Substance Use Topics  . Smoking status: Never Smoker   . Smokeless tobacco: Not on file  . Alcohol Use: No    OB History    Grav Para Term Preterm Abortions TAB SAB Ect Mult Living                  Review of Systems Denies nausea headache chest pain vomiting dark stool Re: stool blurred vision double vision abdominal pain back pain, dark urine follow urine Weakness of any one side of the body fever or chills.  Allergies  Review of patient's allergies indicates no known allergies.  Home Medications   Current Outpatient Rx  Name Route Sig Dispense Refill  . CLINDINIUM-CHLORDIAZEPOXIDE 2.5-5 MG PO CAPS Oral Take 1-2 capsules by mouth every 4 (four) hours as needed. For abdominal pain    . LEVONORGESTREL-ETHINYL ESTRAD 0.1-20 MG-MCG PO TABS Oral Take 1 tablet by mouth daily.    Marland Kitchen MESALAMINE ER 0.375 G PO CP24 Oral Take 1.5 mg by mouth daily. 4 capsules    . TRAMADOL HCL 50 MG PO TABS Oral Take 1 tablet (50 mg total) by mouth every 6 (six) hours as  needed for pain. 15 tablet 0    BP 108/72  Pulse 72  Temp 98.3 F (36.8 C) (Oral)  Resp 16  SpO2 100%  LMP 10/07/2011  Physical Exam \\Alert  pleasant African American female, no icterus no pallor To clear good dentition Chest clinically clear no added sound S1-S2 no murmur rub or gallop Abdomen soft nontender no rebound or guarding, no CVA tenderness Extremities soft nontender nonswollen PATIENT DEFERS WET PREP  ED Course  Procedures (including critical care time)  Labs Reviewed - No data to display No results found.   No diagnosis found.    MDM  Patient is a 21 year old female with history of recently diagnosed ulcerative colitis who presents for the second or third time this year with nonspecific vaginitis Patient defers wet prep.  I have explained to patient that she likely chest check ammonia is likely secondary to sexual activity and have counseled her with regards to having her boyfriend treated the same way. Have recommended to her that she needs to use condoms with him for the time being until he can be treated Patient understands that she is to get a primary care physician close to town and should followup with a  gastroenterologist in the near future. Patient has had a negative Chlamydia gonorrhea screen within the last 6 months and has had a wet prep done 09/08/2011 which was consistent with trichmoniasuis.  Patient once again understands the risks benefits and alternatives to treatment and Which is vaginal testing at this time I will treat her with 2 g of Flagyl by mouth stat.        Rhetta Mura, MD 10/18/11 1145

## 2011-10-18 NOTE — ED Notes (Signed)
Pt unable to void, given water to drink.

## 2011-10-18 NOTE — ED Notes (Signed)
C/o vaginal irritation and slight discharge since yesterday.  Denies itching or other sx.

## 2011-11-23 ENCOUNTER — Ambulatory Visit (INDEPENDENT_AMBULATORY_CARE_PROVIDER_SITE_OTHER): Payer: BC Managed Care – PPO | Admitting: Family Medicine

## 2011-11-23 VITALS — BP 102/68 | HR 64 | Temp 98.7°F | Resp 18 | Ht 67.0 in | Wt 122.0 lb

## 2011-11-23 DIAGNOSIS — K5190 Ulcerative colitis, unspecified, without complications: Secondary | ICD-10-CM

## 2011-11-23 DIAGNOSIS — M5380 Other specified dorsopathies, site unspecified: Secondary | ICD-10-CM

## 2011-11-23 DIAGNOSIS — S134XXA Sprain of ligaments of cervical spine, initial encounter: Secondary | ICD-10-CM

## 2011-11-23 DIAGNOSIS — S139XXA Sprain of joints and ligaments of unspecified parts of neck, initial encounter: Secondary | ICD-10-CM

## 2011-11-23 DIAGNOSIS — M62830 Muscle spasm of back: Secondary | ICD-10-CM

## 2011-11-23 MED ORDER — HYDROCODONE-ACETAMINOPHEN 5-325 MG PO TABS: 1.0000 | ORAL_TABLET | Freq: Four times a day (QID) | ORAL | Status: DC | PRN

## 2011-11-23 MED ORDER — CYCLOBENZAPRINE HCL 10 MG PO TABS: 10.0000 mg | ORAL_TABLET | Freq: Three times a day (TID) | ORAL | Status: DC | PRN

## 2011-11-23 NOTE — Patient Instructions (Addendum)
Whiplash    Whiplash is a soft tissue injury to the neck. It is also called neck sprain or neck strain. It is a collection of symptoms that occur after sudden extension and flexion of the neck, as happens in an automobile crash. Whiplash is not due to a bone fracture, dislocation, or a disc that sticks out (herniated).  CAUSES   The disorder commonly occurs as the result of an automobile crash.  SYMPTOMS   · Neck pain may be present directly after the injury or may be delayed for several days.   · In addition to neck pain, other symptoms may include:   · Neck stiffness.   · Injuries to the muscles and ligaments.   · Headache.   · Dizziness.   · Abnormal sensations such as burning or prickling (paresthesias).   · Shoulder or back pain.   · Some people experience conditions such as:   · Memory loss.   · Concentration impairment.   · Nervousness.   · Irritability.   · Sleep disturbances.   · Fatigue.   · Depression.   TREATMENT   Treatment for individuals with whiplash may include:  · Pain medications.   · Nonsteroidal anti-inflammatory drugs.   · Antidepressants.   · Cervical collar.   · Range of motion exercises.   · Physical therapy.   · Supplemental heat application may relieve muscle tension.   LENGTH OF ILLNESS  Generally, the prognosis for individuals with whiplash is excellent. The neck and head pain clears within a few days or weeks. Most patients recover within 3 months after the injury. However, some may continue to have lasting neck pain and headaches.  Document Released: 11/25/2004 Document Revised: 10/28/2010 Document Reviewed: 08/05/2008  ExitCare® Patient Information ©2012 [employer].
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
## 2011-12-21 ENCOUNTER — Ambulatory Visit (INDEPENDENT_AMBULATORY_CARE_PROVIDER_SITE_OTHER): Payer: BC Managed Care – PPO | Admitting: Family Medicine

## 2011-12-21 VITALS — BP 126/84 | HR 67 | Temp 97.9°F | Resp 16 | Ht 66.0 in | Wt 125.0 lb

## 2011-12-21 DIAGNOSIS — R350 Frequency of micturition: Secondary | ICD-10-CM

## 2011-12-21 DIAGNOSIS — N926 Irregular menstruation, unspecified: Secondary | ICD-10-CM

## 2011-12-21 DIAGNOSIS — J029 Acute pharyngitis, unspecified: Secondary | ICD-10-CM

## 2011-12-21 DIAGNOSIS — N390 Urinary tract infection, site not specified: Secondary | ICD-10-CM

## 2011-12-21 LAB — POCT URINALYSIS DIPSTICK
Nitrite, UA: NEGATIVE
Urobilinogen, UA: 0.2
pH, UA: 7

## 2011-12-21 LAB — POCT UA - MICROSCOPIC ONLY
Casts, Ur, LPF, POC: NEGATIVE
Mucus, UA: NEGATIVE
Yeast, UA: NEGATIVE

## 2011-12-21 MED ORDER — CIPROFLOXACIN HCL 250 MG PO TABS: 250.0000 mg | ORAL_TABLET | Freq: Two times a day (BID) | ORAL | Status: DC

## 2011-12-21 NOTE — Progress Notes (Signed)
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  She has 18 hours of hematuria and urgency.  She has had some dysuria, and took two cranberry pills today.  Symptoms have eased some today.  Also, she missed period the last cycle.  Also, complains of sore throat and nasal congestion x 2 days without fever.  She is taking some robitussin for the cough which has helped.  Works in admissions at [employer]  Objective:  NAD No CVAT Throat: erythematous with mild swelling left tonsil, no exudates Ears:  Normal TM's Neck:  No adenopathy.  Results for orders placed in visit on 12/21/11  POCT UA - MICROSCOPIC ONLY      Component Value Range   WBC, Ur, HPF, POC TNTC     RBC, urine, microscopic 25-30     Bacteria, U Microscopic small     Mucus, UA neg     Epithelial cells, urine per micros 2-4     Crystals, Ur, HPF, POC neg     Casts, Ur, LPF, POC neg     Yeast, UA neg    POCT URINALYSIS DIPSTICK      Component Value Range   Color, UA yellow     Clarity, UA cloudy     Glucose, UA neg     Bilirubin, UA neg     Ketones, UA neg     Spec Grav, UA 1.020     Blood, UA large     pH, UA 7.0     Protein, UA trace     Urobilinogen, UA 0.2     Nitrite, UA neg     Leukocytes, UA large (3+)    POCT URINE PREGNANCY      Component Value Range   Preg Test, Ur Negative     Assessment:  Pharyngitis and UTI  Plan:

## 2011-12-21 NOTE — Patient Instructions (Signed)
Urinary Tract Infection Urinary tract infections (UTIs) can develop anywhere along your urinary tract. Your urinary tract is your body's drainage system for removing wastes and extra water. Your urinary tract includes two kidneys, two ureters, a bladder, and a urethra. Your kidneys are a pair of bean-shaped organs. Each kidney is about the size of your fist. They are located below your ribs, one on each side of your spine. CAUSES Infections are caused by microbes, which are microscopic organisms, including fungi, viruses, and bacteria. These organisms are so small that they can only be seen through a microscope. Bacteria are the microbes that most commonly cause UTIs. SYMPTOMS  Symptoms of UTIs may vary by age and gender of the patient and by the location of the infection. Symptoms in young women typically include a frequent and intense urge to urinate and a painful, burning feeling in the bladder or urethra during urination. Older women and men are more likely to be tired, shaky, and weak and have muscle aches and abdominal pain. A fever may mean the infection is in your kidneys. Other symptoms of a kidney infection include pain in your back or sides below the ribs, nausea, and vomiting. DIAGNOSIS To diagnose a UTI, your caregiver will ask you about your symptoms. Your caregiver also will ask to provide a urine sample. The urine sample will be tested for bacteria and white blood cells. White blood cells are made by your body to help fight infection. TREATMENT  Typically, UTIs can be treated with medication. Because most UTIs are caused by a bacterial infection, they usually can be treated with the use of antibiotics. The choice of antibiotic and length of treatment depend on your symptoms and the type of bacteria causing your infection. HOME CARE INSTRUCTIONS  If you were prescribed antibiotics, take them exactly as your caregiver instructs you. Finish the medication even if you feel better after you  have only taken some of the medication.  Drink enough water and fluids to keep your urine clear or pale yellow.  Avoid caffeine, tea, and carbonated beverages. They tend to irritate your bladder.  Empty your bladder often. Avoid holding urine for long periods of time.  Empty your bladder before and after sexual intercourse.  After a bowel movement, women should cleanse from front to back. Use each tissue only once. SEEK MEDICAL CARE IF:   You have back pain.  You develop a fever.  Your symptoms do not begin to resolve within 3 days. SEEK IMMEDIATE MEDICAL CARE IF:   You have severe back pain or lower abdominal pain.  You develop chills.  You have nausea or vomiting.  You have continued burning or discomfort with urination. MAKE SURE YOU:   Understand these instructions.  Will watch your condition.  Will get help right away if you are not doing well or get worse. Document Released: 11/25/2004 Document Revised: 08/17/2011 Document Reviewed: 03/26/2011 ExitCare Patient Information 2013 [employer].  

## 2011-12-23 LAB — URINE CULTURE: Colony Count: 40000

## 2012-01-12 ENCOUNTER — Other Ambulatory Visit: Payer: Self-pay | Admitting: Gastroenterology

## 2012-01-12 ENCOUNTER — Ambulatory Visit
Admission: RE | Admit: 2012-01-12 | Discharge: 2012-01-12 | Disposition: A | Discharge status: Home / Self Care | Payer: BC Managed Care – PPO | Source: Ambulatory Visit | Attending: Gastroenterology | Admitting: Gastroenterology

## 2012-01-12 DIAGNOSIS — R109 Unspecified abdominal pain: Secondary | ICD-10-CM

## 2012-01-13 ENCOUNTER — Other Ambulatory Visit: Payer: Self-pay | Admitting: Gastroenterology

## 2012-01-13 DIAGNOSIS — R109 Unspecified abdominal pain: Secondary | ICD-10-CM

## 2012-01-14 ENCOUNTER — Ambulatory Visit
Admission: RE | Admit: 2012-01-14 | Discharge: 2012-01-14 | Disposition: A | Discharge status: Home / Self Care | Payer: BC Managed Care – PPO | Source: Ambulatory Visit | Attending: Gastroenterology | Admitting: Gastroenterology

## 2012-01-14 DIAGNOSIS — R109 Unspecified abdominal pain: Secondary | ICD-10-CM

## 2012-01-14 MED ORDER — IOHEXOL 300 MG/ML  SOLN
100.0000 mL | Freq: Once | INTRAMUSCULAR | Status: AC | PRN
  Administered 2012-01-14: 100 mL via INTRAVENOUS

## 2012-03-21 ENCOUNTER — Ambulatory Visit (INDEPENDENT_AMBULATORY_CARE_PROVIDER_SITE_OTHER): Payer: BC Managed Care – PPO | Admitting: Physician Assistant

## 2012-03-21 VITALS — BP 98/64 | HR 83 | Temp 97.9°F | Resp 16 | Ht 66.0 in | Wt 125.4 lb

## 2012-03-21 DIAGNOSIS — H109 Unspecified conjunctivitis: Secondary | ICD-10-CM

## 2012-03-21 DIAGNOSIS — H1089 Other conjunctivitis: Secondary | ICD-10-CM

## 2012-03-21 MED ORDER — CIPROFLOXACIN HCL 0.3 % OP SOLN: 1.0000 [drp] | OPHTHALMIC | Status: DC

## 2012-03-21 NOTE — Progress Notes (Signed)
  Subjective:    Patient ID: Christina Young, female    DOB: [birth year], 22 y.o.   MRN: [id number]  HPI 22 year old female presents with 2 day history of right eye erythema and purulent drainage. States she woke up this morning with her right eyelid matted shut and swollen.  Denies vision changes but does have some blurry vision secondary to drainage.  Denies headache, otalgia, dizziness, nausea, or vomiting.  She has had slight rhinorrhea and dry cough, but no fever or chills.  She is otherwise healthy with no other concerns today.     Review of Systems  Constitutional: Negative for fever and chills.  HENT: Positive for rhinorrhea. Negative for ear pain, congestion, sore throat, neck pain, postnasal drip and sinus pressure.   Eyes: Positive for discharge and visual disturbance (blurry). Negative for photophobia, pain and itching.  Respiratory: Positive for cough. Negative for chest tightness, shortness of breath and wheezing.   Gastrointestinal: Negative for nausea and vomiting.  Neurological: Negative for dizziness, light-headedness and headaches.       Objective:   Physical Exam  Constitutional: She appears well-developed and well-nourished.  HENT:  Head: Normocephalic and atraumatic.  Right Ear: Hearing, tympanic membrane, external ear and ear canal normal.  Left Ear: Hearing, tympanic membrane, external ear and ear canal normal.  Mouth/Throat: Uvula is midline, oropharynx is clear and moist and mucous membranes are normal. No oropharyngeal exudate.  Eyes: EOM are normal. Pupils are equal, round, and reactive to light. Right eye exhibits discharge (purulent). Right eye exhibits no hordeolum. Right conjunctiva is injected. Left conjunctiva is not injected.       Right upper and lower eyelid swollen          Assessment & Plan:   1. Bacterial conjunctivitis   She is a contact wearer so will rx ciloxan as directed Warm compresses 3-4 times daily Discard current contacts and wear  glasses x 7 days. New pair of contacts after completion of antibiotics Follow up here or with ophthalmology if symptoms worsen or fail to improve.

## 2012-07-26 ENCOUNTER — Emergency Department (HOSPITAL_COMMUNITY): Payer: BC Managed Care – PPO

## 2012-07-26 ENCOUNTER — Encounter (HOSPITAL_COMMUNITY): Payer: Self-pay | Admitting: *Deleted

## 2012-07-26 ENCOUNTER — Emergency Department (HOSPITAL_COMMUNITY)
Admission: EM | Admit: 2012-07-26 | Discharge: 2012-07-26 | Disposition: A | Discharge status: Home / Self Care | Payer: BC Managed Care – PPO | Attending: Emergency Medicine | Admitting: Emergency Medicine

## 2012-07-26 DIAGNOSIS — N83209 Unspecified ovarian cyst, unspecified side: Secondary | ICD-10-CM

## 2012-07-26 DIAGNOSIS — K5190 Ulcerative colitis, unspecified, without complications: Secondary | ICD-10-CM | POA: Insufficient documentation

## 2012-07-26 DIAGNOSIS — Z79899 Other long term (current) drug therapy: Secondary | ICD-10-CM | POA: Insufficient documentation

## 2012-07-26 DIAGNOSIS — N831 Corpus luteum cyst of ovary, unspecified side: Secondary | ICD-10-CM | POA: Insufficient documentation

## 2012-07-26 LAB — URINALYSIS, ROUTINE W REFLEX MICROSCOPIC
Glucose, UA: NEGATIVE mg/dL
Leukocytes, UA: NEGATIVE
Specific Gravity, Urine: 1.025 (ref 1.005–1.030)
pH: 6.5 (ref 5.0–8.0)

## 2012-07-26 LAB — POCT PREGNANCY, URINE: Preg Test, Ur: NEGATIVE

## 2012-07-26 LAB — WET PREP, GENITAL
Trich, Wet Prep: NONE SEEN
Yeast Wet Prep HPF POC: NONE SEEN

## 2012-07-26 MED ORDER — PROMETHAZINE HCL 25 MG PO TABS: 25.0000 mg | ORAL_TABLET | Freq: Four times a day (QID) | ORAL | Status: DC | PRN

## 2012-07-26 MED ORDER — OXYCODONE-ACETAMINOPHEN 5-325 MG PO TABS
2.0000 | ORAL_TABLET | Freq: Once | ORAL | Status: AC
  Administered 2012-07-26: 2 via ORAL
  Filled 2012-07-26: qty 2

## 2012-07-26 MED ORDER — OXYCODONE-ACETAMINOPHEN 5-325 MG PO TABS: 2.0000 | ORAL_TABLET | Freq: Four times a day (QID) | ORAL | Status: DC | PRN

## 2012-07-26 MED ORDER — HYDROCODONE-ACETAMINOPHEN 5-325 MG PO TABS
2.0000 | ORAL_TABLET | Freq: Once | ORAL | Status: AC
  Administered 2012-07-26: 2 via ORAL
  Filled 2012-07-26: qty 2

## 2012-07-26 MED ORDER — ONDANSETRON 8 MG PO TBDP
8.0000 mg | ORAL_TABLET | Freq: Once | ORAL | Status: AC
  Administered 2012-07-26: 8 mg via ORAL
  Filled 2012-07-26: qty 1

## 2012-07-26 NOTE — ED Notes (Signed)
Patient transported to Ultrasound 

## 2012-07-26 NOTE — ED Provider Notes (Signed)
History     CSN: [id number]  Arrival date & time 07/26/12  1022   First MD Initiated Contact with Patient 07/26/12 1049      Chief Complaint  Patient presents with  . Abdominal Pain    (Consider location/radiation/quality/duration/timing/severity/associated sxs/prior treatment) HPI Comments: Patient is a 22 year old female with history of ulcerative colitis who presents today with 2 days of gradually worsening left lower quadrant pain. She states the pain is sharp and radiates to her back. Initially she thought it was a urinary tract infections as she began to take cranberry pills and drink cranberry juice. It did not help. She states this does not feel like exacerbation of her ulcerative colitis. Reports a history of a miscarriage 2 and half weeks ago. At that time she was diagnosed with a ovarian cyst which she was told to go down on its own. She denies fevers, chills, nausea, vomiting, diarrhea, constipation, vaginal discharge, vaginal bleeding, dysuria, urinary urgency, urinary frequency.   The history is provided by the patient. No language interpreter was used.    Past Medical History  Diagnosis Date  . Ulcerative colitis     History reviewed. No pertinent past surgical history.  History reviewed. No pertinent family history.  History  Substance Use Topics  . Smoking status: Never Smoker   . Smokeless tobacco: Not on file  . Alcohol Use: No    OB History   Grav Para Term Preterm Abortions TAB SAB Ect Mult Living   1    1           Review of Systems  Constitutional: Negative for fever and chills.  Respiratory: Negative for shortness of breath.   Cardiovascular: Negative for chest pain.  Gastrointestinal: Positive for abdominal pain. Negative for nausea and vomiting.  Genitourinary: Negative for dysuria, urgency, vaginal bleeding, vaginal discharge and vaginal pain.  All other systems reviewed and are negative.    Allergies  Review of patient's allergies  indicates no known allergies.  Home Medications   Current Outpatient Rx  Name  Route  Sig  Dispense  Refill  . omeprazole (PRILOSEC) 20 MG capsule   Oral   Take 20 mg by mouth daily.           BP 122/67  Pulse 65  Temp(Src) 99.6 F (37.6 C) (Oral)  Resp 18  SpO2 100%  Physical Exam  Nursing note and vitals reviewed. Constitutional: She is oriented to person, place, and time. She appears well-developed and well-nourished. No distress.  HENT:  Head: Normocephalic and atraumatic.  Right Ear: External ear normal.  Left Ear: External ear normal.  Nose: Nose normal.  Mouth/Throat: Oropharynx is clear and moist.  Eyes: Conjunctivae are normal.  Neck: Normal range of motion.  Cardiovascular: Normal rate, regular rhythm and normal heart sounds.   Pulmonary/Chest: Effort normal and breath sounds normal. No stridor. No respiratory distress. She has no wheezes. She has no rales.  Abdominal: Soft. Normal appearance. She exhibits no distension. There is tenderness in the left lower quadrant. There is guarding. There is no rigidity, no rebound and no CVA tenderness.  Musculoskeletal: Normal range of motion.  Neurological: She is alert and oriented to person, place, and time. She has normal strength.  Skin: Skin is warm and dry. She is not diaphoretic. No erythema.  Psychiatric: She has a normal mood and affect. Her behavior is normal.    ED Course  Procedures (including critical care time)  Labs Reviewed  WET PREP, GENITAL -  Abnormal; Notable for the following:    Clue Cells Wet Prep HPF POC MODERATE (*)    WBC, Wet Prep HPF POC MODERATE (*)    All other components within normal limits  URINALYSIS, ROUTINE W REFLEX MICROSCOPIC - Abnormal; Notable for the following:    APPearance CLOUDY (*)    All other components within normal limits  GC/CHLAMYDIA PROBE AMP  HCG, QUANTITATIVE, PREGNANCY  POCT PREGNANCY, URINE   US Transvaginal Non-ob  07/26/2012   *RADIOLOGY REPORT*   Clinical Data: Spontaneous abortion May 2014, pain, negative pregnancy test, question retained products of conception  TRANSABDOMINAL AND TRANSVAGINAL ULTRASOUND OF PELVIS DOPPLER ULTRASOUND OF THE PELVIS  Technique:  Both transabdominal and transvaginal ultrasound examinations of the pelvis were performed. Transabdominal technique was performed for global imaging of the pelvis including uterus, ovaries, adnexal regions, and pelvic cul-de-sac. Additionally, duplex sonography of the pelvis was performed with attention to the assessment of the arterial and venous blood flow in both ovaries.  It was necessary to proceed with endovaginal exam following the transabdominal exam to visualize the ovaries.  Comparison:  None  Findings:  Uterus: 7.4 x 3.3 x 5.5 cm.  Normal morphology without mass.  Endometrium: 9 mm thick, normal.  No endometrial fluid.  No retained products of conception identified.  Right ovary:  3.3 x 2.6 x 3.3 cm.  Normal morphology without mass. Internal blood flow present on color Doppler imaging.  Left ovary: 5.1 x 4.5 x 4.8 cm.  Complex cyst left ovary question hemorrhagic cyst 4.5 x 3.3 x 3.9 cm.  Blood flow present within the left ovary on color Doppler imaging.  Other findings: Small amount of nonspecific free pelvic fluid.  No adnexal masses.  Duplex sonography demonstrates presence of venous and low resistance arterial waveforms within both ovaries.  IMPRESSION: No evidence of retained products of conception. Complex likely hemorrhagic cyst in the left ovary 4.5 x 3.3 x 3.9 cm. No evidence of ovarian torsion.   Original Report Authenticated By: Ulyses Southward, M.D.   US Pelvis Complete  07/26/2012   *RADIOLOGY REPORT*  Clinical Data: Spontaneous abortion May 2014, pain, negative pregnancy test, question retained products of conception  TRANSABDOMINAL AND TRANSVAGINAL ULTRASOUND OF PELVIS DOPPLER ULTRASOUND OF THE PELVIS  Technique:  Both transabdominal and transvaginal ultrasound examinations of  the pelvis were performed. Transabdominal technique was performed for global imaging of the pelvis including uterus, ovaries, adnexal regions, and pelvic cul-de-sac. Additionally, duplex sonography of the pelvis was performed with attention to the assessment of the arterial and venous blood flow in both ovaries.  It was necessary to proceed with endovaginal exam following the transabdominal exam to visualize the ovaries.  Comparison:  None  Findings:  Uterus: 7.4 x 3.3 x 5.5 cm.  Normal morphology without mass.  Endometrium: 9 mm thick, normal.  No endometrial fluid.  No retained products of conception identified.  Right ovary:  3.3 x 2.6 x 3.3 cm.  Normal morphology without mass. Internal blood flow present on color Doppler imaging.  Left ovary: 5.1 x 4.5 x 4.8 cm.  Complex cyst left ovary question hemorrhagic cyst 4.5 x 3.3 x 3.9 cm.  Blood flow present within the left ovary on color Doppler imaging.  Other findings: Small amount of nonspecific free pelvic fluid.  No adnexal masses.  Duplex sonography demonstrates presence of venous and low resistance arterial waveforms within both ovaries.  IMPRESSION: No evidence of retained products of conception. Complex likely hemorrhagic cyst in the left ovary 4.5 x  3.3 x 3.9 cm. No evidence of ovarian torsion.   Original Report Authenticated By: Ulyses Southward, M.D.   [place] Art/ven Flow Abd Pelv Doppler  07/26/2012   *RADIOLOGY REPORT*  Clinical Data: Spontaneous abortion May 2014, pain, negative pregnancy test, question retained products of conception  TRANSABDOMINAL AND TRANSVAGINAL ULTRASOUND OF PELVIS DOPPLER ULTRASOUND OF THE PELVIS  Technique:  Both transabdominal and transvaginal ultrasound examinations of the pelvis were performed. Transabdominal technique was performed for global imaging of the pelvis including uterus, ovaries, adnexal regions, and pelvic cul-de-sac. Additionally, duplex sonography of the pelvis was performed with attention to the assessment of the  arterial and venous blood flow in both ovaries.  It was necessary to proceed with endovaginal exam following the transabdominal exam to visualize the ovaries.  Comparison:  None  Findings:  Uterus: 7.4 x 3.3 x 5.5 cm.  Normal morphology without mass.  Endometrium: 9 mm thick, normal.  No endometrial fluid.  No retained products of conception identified.  Right ovary:  3.3 x 2.6 x 3.3 cm.  Normal morphology without mass. Internal blood flow present on color Doppler imaging.  Left ovary: 5.1 x 4.5 x 4.8 cm.  Complex cyst left ovary question hemorrhagic cyst 4.5 x 3.3 x 3.9 cm.  Blood flow present within the left ovary on color Doppler imaging.  Other findings: Small amount of nonspecific free pelvic fluid.  No adnexal masses.  Duplex sonography demonstrates presence of venous and low resistance arterial waveforms within both ovaries.  IMPRESSION: No evidence of retained products of conception. Complex likely hemorrhagic cyst in the left ovary 4.5 x 3.3 x 3.9 cm. No evidence of ovarian torsion.   Original Report Authenticated By: Ulyses Southward, M.D.     1. Hemorrhagic ovarian cyst       MDM  Presents with gradually worsening left sided abdominal pain. She had a spontaneous abortion 2 half weeks ago and was diagnosed with a cyst at that point. No evidence of retained products of conception on ultrasound. No torsion. Hemorrhagic cyst left ovary which likely the cause of her pain. Her OB requested and HCG quant which was <1. Urine pregnancy negative. Sent home with rx for pain medication. Discussed case with Dr. Patria Mane who agrees with plan. Return instructions given. Vital signs stable for discharge. Patient / Family / Caregiver informed of clinical course, understand medical decision-making process, and agree with plan.        Mora Bellman, PA-C 07/26/12 1651

## 2012-07-26 NOTE — ED Notes (Addendum)
Pt reports miscarriage on Mothers day, had cramping but resolved, was also told she had an ovarian cyst that would go away on its own.hx of ulcerative colitis, does not think this pain is related to that. Pt reports left sided abdominal pain x2 days. 8/10. Denies n/v or dysuria. Last bowel movement this am. Denies vaginal discharge.

## 2012-07-27 ENCOUNTER — Emergency Department (HOSPITAL_COMMUNITY): Payer: BC Managed Care – PPO

## 2012-07-27 ENCOUNTER — Emergency Department (HOSPITAL_COMMUNITY)
Admission: EM | Admit: 2012-07-27 | Discharge: 2012-07-27 | Disposition: A | Discharge status: Home / Self Care | Payer: BC Managed Care – PPO | Attending: Emergency Medicine | Admitting: Emergency Medicine

## 2012-07-27 DIAGNOSIS — Z8719 Personal history of other diseases of the digestive system: Secondary | ICD-10-CM | POA: Insufficient documentation

## 2012-07-27 DIAGNOSIS — R102 Pelvic and perineal pain: Secondary | ICD-10-CM

## 2012-07-27 DIAGNOSIS — R109 Unspecified abdominal pain: Secondary | ICD-10-CM | POA: Insufficient documentation

## 2012-07-27 DIAGNOSIS — Z8742 Personal history of other diseases of the female genital tract: Secondary | ICD-10-CM | POA: Insufficient documentation

## 2012-07-27 DIAGNOSIS — N83209 Unspecified ovarian cyst, unspecified side: Secondary | ICD-10-CM | POA: Insufficient documentation

## 2012-07-27 LAB — POCT I-STAT, CHEM 8
BUN: 8 mg/dL (ref 6–23)
Calcium, Ion: 1.24 mmol/L — ABNORMAL HIGH (ref 1.12–1.23)
Creatinine, Ser: 0.9 mg/dL (ref 0.50–1.10)
Glucose, Bld: 60 mg/dL — ABNORMAL LOW (ref 70–99)
Hemoglobin: 13.6 g/dL (ref 12.0–15.0)
TCO2: 27 mmol/L (ref 0–100)

## 2012-07-27 LAB — CBC WITH DIFFERENTIAL/PLATELET
Eosinophils Absolute: 0.1 10*3/uL (ref 0.0–0.7)
Eosinophils Relative: 2 % (ref 0–5)
HCT: 37.2 % (ref 36.0–46.0)
Lymphocytes Relative: 45 % (ref 12–46)
Lymphs Abs: 2.4 10*3/uL (ref 0.7–4.0)
MCH: 27.4 pg (ref 26.0–34.0)
MCV: 85.5 fL (ref 78.0–100.0)
Monocytes Absolute: 0.5 10*3/uL (ref 0.1–1.0)
RBC: 4.35 MIL/uL (ref 3.87–5.11)
WBC: 5.4 10*3/uL (ref 4.0–10.5)

## 2012-07-27 LAB — GC/CHLAMYDIA PROBE AMP: CT Probe RNA: NEGATIVE

## 2012-07-27 MED ORDER — ACETAMINOPHEN 325 MG PO TABS
650.0000 mg | ORAL_TABLET | Freq: Once | ORAL | Status: AC
  Administered 2012-07-27: 650 mg via ORAL
  Filled 2012-07-27: qty 2

## 2012-07-27 MED ORDER — IBUPROFEN 800 MG PO TABS
800.0000 mg | ORAL_TABLET | Freq: Once | ORAL | Status: DC
  Filled 2012-07-27: qty 1

## 2012-07-27 MED ORDER — SODIUM CHLORIDE 0.9 % IV SOLN
INTRAVENOUS | Status: DC
  Administered 2012-07-27 (×2): via INTRAVENOUS

## 2012-07-27 NOTE — ED Provider Notes (Signed)
Medical screening examination/treatment/procedure(s) were performed by non-physician practitioner and as supervising physician I was immediately available for consultation/collaboration.   Lyanne Co, MD 07/27/12 [phone]

## 2012-07-27 NOTE — ED Notes (Signed)
Pt sts she was d/c'ed with Percocet yesterday and it worked well.  Sts she woke this morning and her pain was worse.  Sts she did not take any Percocet today.

## 2012-07-27 NOTE — ED Notes (Addendum)
Unsuccessful IV attempt x 1.  Asked S. Chad, RN to attempt.

## 2012-07-27 NOTE — ED Provider Notes (Signed)
History    CSN: [id number] rrival date & time 07/27/12  1244 First MD Initiated Contact with Patient 07/27/12 1255      Chief Complaint  Patient presents with  . Abdominal Pain    HPI Patient presents emergent complaints of left lower quadrant abdominal pain. The symptoms have been ongoing for at least the last 2 days.  Pain is sharp and does radiate to her back. Patient thought initially was urinary tract infections issues trying cranberry juice. She has not had any trouble with diarrhea. She does have a history of ulcer colitis but this feels different.  Patient also has history of a recent miscarriage 2 weeks ago. Patient was exiting in emergent department yesterday and had an ultrasound that showed a hemorrhagic ovarian cyst. the patient was discharged home with a prescription for Percocet. Patient states she was taking her medications but this morning the pain was more severe. She did not take any of her pills this morning. She came to the emergency room because of her persistent symptoms Past Medical History  Diagnosis Date  . Ulcerative colitis     No past surgical history on file.  No family history on file.  History  Substance Use Topics  . Smoking status: Never Smoker   . Smokeless tobacco: Not on file  . Alcohol Use: No    OB History   Grav Para Term Preterm Abortions TAB SAB Ect Mult Living   1    1           Review of Systems  All other systems reviewed and are negative.    Allergies  Review of patient's allergies indicates no known allergies.  Home Medications   Current Outpatient Rx  Name  Route  Sig  Dispense  Refill  . acetaminophen (TYLENOL) 500 MG tablet   Oral   Take 500 mg by mouth every 6 (six) hours as needed for pain.         Marland Kitchen oxyCODONE-acetaminophen (PERCOCET/ROXICET) 5-325 MG per tablet   Oral   Take 2 tablets by mouth every 6 (six) hours as needed for pain.   15 tablet   0   . promethazine (PHENERGAN) 25 MG tablet   Oral   Take 1  tablet (25 mg total) by mouth every 6 (six) hours as needed for nausea.   12 tablet   0     BP 108/58  Pulse 60  Temp(Src) 98.1 F (36.7 C) (Oral)  Resp 12  Ht 5\' 6"  (1.676 m)  Wt 118 lb (53.524 kg)  BMI 19.05 kg/m2  SpO2 98%  Physical Exam  Nursing note and vitals reviewed. Constitutional: She appears well-developed and well-nourished. No distress.  HENT:  Head: Normocephalic and atraumatic.  Right Ear: External ear normal.  Left Ear: External ear normal.  Eyes: Conjunctivae are normal. Right eye exhibits no discharge. Left eye exhibits no discharge. No scleral icterus.  Neck: Neck supple. No tracheal deviation present.  Cardiovascular: Normal rate, regular rhythm and intact distal pulses.   Pulmonary/Chest: Effort normal and breath sounds normal. No stridor. No respiratory distress. She has no wheezes. She has no rales.  Abdominal: Soft. Bowel sounds are normal. She exhibits no distension. There is tenderness (mild left lower quadrant). There is no rebound and no guarding.  Musculoskeletal: She exhibits no edema and no tenderness.  Neurological: She is alert. She has normal strength. No sensory deficit. Cranial nerve deficit:  no gross defecits noted. She exhibits normal muscle tone. She displays  no seizure activity. Coordination normal.  Skin: Skin is warm and dry. No rash noted.  Psychiatric: She has a normal mood and affect.    ED Course  Procedures (including critical care time) Medications  0.9 %  sodium chloride infusion (not administered)  acetaminophen (TYLENOL) tablet 650 mg (650 mg Oral Given 07/27/12 1353)    Labs Reviewed  CBC WITH DIFFERENTIAL - Abnormal; Notable for the following:    Hemoglobin 11.9 (*)    All other components within normal limits  POCT I-STAT, CHEM 8 - Abnormal; Notable for the following:    Glucose, Bld 60 (*)    Calcium, Ion 1.24 (*)    All other components within normal limits   US Transvaginal Non-ob  07/26/2012   *RADIOLOGY  REPORT*  Clinical Data: Spontaneous abortion May 2014, pain, negative pregnancy test, question retained products of conception  TRANSABDOMINAL AND TRANSVAGINAL ULTRASOUND OF PELVIS DOPPLER ULTRASOUND OF THE PELVIS  Technique:  Both transabdominal and transvaginal ultrasound examinations of the pelvis were performed. Transabdominal technique was performed for global imaging of the pelvis including uterus, ovaries, adnexal regions, and pelvic cul-de-sac. Additionally, duplex sonography of the pelvis was performed with attention to the assessment of the arterial and venous blood flow in both ovaries.  It was necessary to proceed with endovaginal exam following the transabdominal exam to visualize the ovaries.  Comparison:  None  Findings:  Uterus: 7.4 x 3.3 x 5.5 cm.  Normal morphology without mass.  Endometrium: 9 mm thick, normal.  No endometrial fluid.  No retained products of conception identified.  Right ovary:  3.3 x 2.6 x 3.3 cm.  Normal morphology without mass. Internal blood flow present on color Doppler imaging.  Left ovary: 5.1 x 4.5 x 4.8 cm.  Complex cyst left ovary question hemorrhagic cyst 4.5 x 3.3 x 3.9 cm.  Blood flow present within the left ovary on color Doppler imaging.  Other findings: Small amount of nonspecific free pelvic fluid.  No adnexal masses.  Duplex sonography demonstrates presence of venous and low resistance arterial waveforms within both ovaries.  IMPRESSION: No evidence of retained products of conception. Complex likely hemorrhagic cyst in the left ovary 4.5 x 3.3 x 3.9 cm. No evidence of ovarian torsion.   Original Report Authenticated By: Ulyses Southward, M.D.   US Pelvis Complete  07/26/2012   *RADIOLOGY REPORT*  Clinical Data: Spontaneous abortion May 2014, pain, negative pregnancy test, question retained products of conception  TRANSABDOMINAL AND TRANSVAGINAL ULTRASOUND OF PELVIS DOPPLER ULTRASOUND OF THE PELVIS  Technique:  Both transabdominal and transvaginal ultrasound  examinations of the pelvis were performed. Transabdominal technique was performed for global imaging of the pelvis including uterus, ovaries, adnexal regions, and pelvic cul-de-sac. Additionally, duplex sonography of the pelvis was performed with attention to the assessment of the arterial and venous blood flow in both ovaries.  It was necessary to proceed with endovaginal exam following the transabdominal exam to visualize the ovaries.  Comparison:  None  Findings:  Uterus: 7.4 x 3.3 x 5.5 cm.  Normal morphology without mass.  Endometrium: 9 mm thick, normal.  No endometrial fluid.  No retained products of conception identified.  Right ovary:  3.3 x 2.6 x 3.3 cm.  Normal morphology without mass. Internal blood flow present on color Doppler imaging.  Left ovary: 5.1 x 4.5 x 4.8 cm.  Complex cyst left ovary question hemorrhagic cyst 4.5 x 3.3 x 3.9 cm.  Blood flow present within the left ovary on color Doppler imaging.  Other findings: Small amount of nonspecific free pelvic fluid.  No adnexal masses.  Duplex sonography demonstrates presence of venous and low resistance arterial waveforms within both ovaries.  IMPRESSION: No evidence of retained products of conception. Complex likely hemorrhagic cyst in the left ovary 4.5 x 3.3 x 3.9 cm. No evidence of ovarian torsion.   Original Report Authenticated By: Ulyses Southward, M.D.   [place] Art/ven Flow Abd Pelv Doppler  07/26/2012   *RADIOLOGY REPORT*  Clinical Data: Spontaneous abortion May 2014, pain, negative pregnancy test, question retained products of conception  TRANSABDOMINAL AND TRANSVAGINAL ULTRASOUND OF PELVIS DOPPLER ULTRASOUND OF THE PELVIS  Technique:  Both transabdominal and transvaginal ultrasound examinations of the pelvis were performed. Transabdominal technique was performed for global imaging of the pelvis including uterus, ovaries, adnexal regions, and pelvic cul-de-sac. Additionally, duplex sonography of the pelvis was performed with attention to the  assessment of the arterial and venous blood flow in both ovaries.  It was necessary to proceed with endovaginal exam following the transabdominal exam to visualize the ovaries.  Comparison:  None  Findings:  Uterus: 7.4 x 3.3 x 5.5 cm.  Normal morphology without mass.  Endometrium: 9 mm thick, normal.  No endometrial fluid.  No retained products of conception identified.  Right ovary:  3.3 x 2.6 x 3.3 cm.  Normal morphology without mass. Internal blood flow present on color Doppler imaging.  Left ovary: 5.1 x 4.5 x 4.8 cm.  Complex cyst left ovary question hemorrhagic cyst 4.5 x 3.3 x 3.9 cm.  Blood flow present within the left ovary on color Doppler imaging.  Other findings: Small amount of nonspecific free pelvic fluid.  No adnexal masses.  Duplex sonography demonstrates presence of venous and low resistance arterial waveforms within both ovaries.  IMPRESSION: No evidence of retained products of conception. Complex likely hemorrhagic cyst in the left ovary 4.5 x 3.3 x 3.9 cm. No evidence of ovarian torsion.   Original Report Authenticated By: Ulyses Southward, M.D.     MDM  The patient's abdominal exam is benign however she does have a drop in her hemoglobin from her recent ED visit yesterday.  Patient does not have any peritoneal signs however with her hemorrhagic ovarian cyst in her hemoglobin dropped to repeat her ultrasound to make sure there is no evidence of any cyst rupture with hemorrhage.   Case turned over to oncoming MD pending [place].       Celene Kras, MD 07/27/12 [phone]

## 2012-07-27 NOTE — ED Notes (Signed)
Patient transported to Ultrasound 

## 2012-07-27 NOTE — ED Provider Notes (Signed)
Pt signed out to me by dr. Lynelle Doctor, [place] neg for acute changes, pt stable for d/c  Toy Baker, MD 07/27/12 1723

## 2012-07-27 NOTE — ED Notes (Signed)
Patient reports that she had lower left quadrant abdominal pain starting on Monday. She came to the ED yesterday and received a diagnosis of hemorrhagic ovarian cysts and returned to the ED today because the pain "got worse."

## 2012-08-01 ENCOUNTER — Emergency Department (HOSPITAL_COMMUNITY): Payer: BC Managed Care – PPO

## 2012-08-01 ENCOUNTER — Encounter (HOSPITAL_COMMUNITY): Payer: Self-pay | Admitting: Emergency Medicine

## 2012-08-01 ENCOUNTER — Emergency Department (HOSPITAL_COMMUNITY)
Admission: EM | Admit: 2012-08-01 | Discharge: 2012-08-01 | Disposition: A | Discharge status: Home / Self Care | Payer: BC Managed Care – PPO | Attending: Emergency Medicine | Admitting: Emergency Medicine

## 2012-08-01 DIAGNOSIS — N83209 Unspecified ovarian cyst, unspecified side: Secondary | ICD-10-CM | POA: Insufficient documentation

## 2012-08-01 DIAGNOSIS — Z3202 Encounter for pregnancy test, result negative: Secondary | ICD-10-CM | POA: Insufficient documentation

## 2012-08-01 DIAGNOSIS — N83299 Other ovarian cyst, unspecified side: Secondary | ICD-10-CM

## 2012-08-01 DIAGNOSIS — Z8719 Personal history of other diseases of the digestive system: Secondary | ICD-10-CM | POA: Insufficient documentation

## 2012-08-01 DIAGNOSIS — R1110 Vomiting, unspecified: Secondary | ICD-10-CM | POA: Insufficient documentation

## 2012-08-01 LAB — COMPREHENSIVE METABOLIC PANEL
ALT: 14 U/L (ref 0–35)
Albumin: 3.7 g/dL (ref 3.5–5.2)
Calcium: 9.4 mg/dL (ref 8.4–10.5)
GFR calc Af Amer: >90 mL/min (ref >90)
Glucose, Bld: 96 mg/dL (ref 70–99)
Sodium: 137 mEq/L (ref 135–145)
Total Protein: 6.7 g/dL (ref 6.0–8.3)

## 2012-08-01 LAB — BASIC METABOLIC PANEL
BUN: 10 mg/dL (ref 6–23)
CO2: 26 mEq/L (ref 19–32)
Calcium: 9.6 mg/dL (ref 8.4–10.5)
GFR calc non Af Amer: >90 mL/min (ref >90)
Glucose, Bld: 92 mg/dL (ref 70–99)

## 2012-08-01 LAB — CBC WITH DIFFERENTIAL/PLATELET
Basophils Absolute: 0 10*3/uL (ref 0.0–0.1)
Basophils Relative: 1 % (ref 0–1)
Basophils Relative: 0 % (ref 0–1)
Eosinophils Absolute: 0.1 10*3/uL (ref 0.0–0.7)
Eosinophils Absolute: 0.1 10*3/uL (ref 0.0–0.7)
Eosinophils Relative: 2 % (ref 0–5)
Eosinophils Relative: 1 % (ref 0–5)
Lymphs Abs: 2.6 10*3/uL (ref 0.7–4.0)
MCH: 27.8 pg (ref 26.0–34.0)
MCH: 28.1 pg (ref 26.0–34.0)
MCHC: 32.7 g/dL (ref 30.0–36.0)
MCV: 85.2 fL (ref 78.0–100.0)
MCV: 84.9 fL (ref 78.0–100.0)
Platelets: 263 10*3/uL (ref 150–400)
Platelets: 258 10*3/uL (ref 150–400)
RDW: 13.7 % (ref 11.5–15.5)
RDW: 13.8 % (ref 11.5–15.5)

## 2012-08-01 LAB — URINALYSIS, ROUTINE W REFLEX MICROSCOPIC
Bilirubin Urine: NEGATIVE
Leukocytes, UA: NEGATIVE
Nitrite: NEGATIVE
Specific Gravity, Urine: 1.022 (ref 1.005–1.030)
pH: 5.5 (ref 5.0–8.0)

## 2012-08-01 LAB — URINE MICROSCOPIC-ADD ON

## 2012-08-01 MED ORDER — OXYCODONE-ACETAMINOPHEN 5-325 MG PO TABS
1.0000 | ORAL_TABLET | Freq: Once | ORAL | Status: AC
  Administered 2012-08-01: 1 via ORAL
  Filled 2012-08-01: qty 1

## 2012-08-01 MED ORDER — OXYCODONE-ACETAMINOPHEN 5-325 MG PO TABS: 2.0000 | ORAL_TABLET | Freq: Four times a day (QID) | ORAL | Status: DC | PRN

## 2012-08-01 MED ORDER — SODIUM CHLORIDE 0.9 % IV BOLUS (SEPSIS)
1000.0000 mL | Freq: Once | INTRAVENOUS | Status: AC
  Administered 2012-08-01: 1000 mL via INTRAVENOUS

## 2012-08-01 MED ORDER — MORPHINE SULFATE 4 MG/ML IJ SOLN
4.0000 mg | Freq: Once | INTRAMUSCULAR | Status: AC
  Administered 2012-08-01: 4 mg via INTRAVENOUS
  Filled 2012-08-01: qty 1

## 2012-08-01 MED ORDER — ONDANSETRON HCL 4 MG/2ML IJ SOLN
4.0000 mg | Freq: Once | INTRAMUSCULAR | Status: AC
  Administered 2012-08-01: 4 mg via INTRAVENOUS
  Filled 2012-08-01: qty 2

## 2012-08-01 MED ORDER — ONDANSETRON 8 MG PO TBDP: 8.0000 mg | ORAL_TABLET | Freq: Once | ORAL | Status: DC

## 2012-08-01 NOTE — ED Notes (Signed)
Pt finished her percocet yesterday. Pt states pain is getting worse today she states she feels like the cyst is moving. Pt laying calmly in bed on phone.

## 2012-08-01 NOTE — ED Notes (Signed)
Pt states hx of ovarian cysts.  Was here twice last week for them.  States that she has been vomiting from the pain.  States that she has a Counselling psychologist tomorrow but could not wait.

## 2012-08-01 NOTE — ED Notes (Addendum)
Patient transported to Ultrasound 

## 2012-08-04 NOTE — ED Provider Notes (Signed)
Medical screening examination/treatment/procedure(s) were performed by non-physician practitioner and as supervising physician I was immediately available for consultation/collaboration.  Derran Sear, MD 08/04/12 1527 

## 2012-08-04 NOTE — ED Provider Notes (Signed)
History     CSN: [id number]  Arrival date & time 08/01/12  4098   First MD Initiated Contact with Patient 08/01/12 1113      Chief Complaint  Patient presents with  . Abdominal Pain  . Emesis    (Consider location/radiation/quality/duration/timing/severity/associated sxs/prior treatment) HPI Patient presents to the emergency department with left lower abdominal pain, with a recent history of ovarian cyst.  Patient was seen in the emergency Department twice in the past week for the same complaint.  Patient, states she returns because of continued pain.  The patient, states the pain is constant.  She states that the pain medications to help with the pain.  Patient denies nausea, vomiting, diarrhea, headache, blurred vision, fever, weakness, dizziness, chest pain, shortness of breath, back pain, dysuria, vaginal bleeding, vaginal discharge, rash or syncope.  Patient, states, that she did not take any other medications other than the prescribed pain medication.  Patient, states, that palpation seems to make her pain, worse.   Past Medical History  Diagnosis Date  . Ulcerative colitis   . Physiological ovarian cysts     History reviewed. No pertinent past surgical history.  History reviewed. No pertinent family history.  History  Substance Use Topics  . Smoking status: Never Smoker   . Smokeless tobacco: Not on file  . Alcohol Use: No    OB History   Grav Para Term Preterm Abortions TAB SAB Ect Mult Living   1    1           Review of Systems All other systems negative except as documented in the HPI. All pertinent positives and negatives as reviewed in the HPI. Allergies  Review of patient's allergies indicates no known allergies.  Home Medications   Current Outpatient Rx  Name  Route  Sig  Dispense  Refill  . acetaminophen (TYLENOL) 500 MG tablet   Oral   Take 500 mg by mouth every 6 (six) hours as needed for pain.         . promethazine (PHENERGAN) 25 MG tablet   Oral   Take 1 tablet (25 mg total) by mouth every 6 (six) hours as needed for nausea.   12 tablet   0   . oxyCODONE-acetaminophen (PERCOCET/ROXICET) 5-325 MG per tablet   Oral   Take 2 tablets by mouth every 6 (six) hours as needed for pain.   15 tablet   0     BP 100/55  Pulse 60  Temp(Src) 97.3 F (36.3 C) (Oral)  Resp 16  SpO2 100%  LMP 05/20/2012  Physical Exam  Constitutional: She is oriented to person, place, and time. She appears well-developed and well-nourished. No distress.  HENT:  Head: Normocephalic and atraumatic.  Eyes: Pupils are equal, round, and reactive to light.  Neck: Normal range of motion. Neck supple.  Cardiovascular: Normal rate, regular rhythm and normal heart sounds.  Exam reveals no gallop and no friction rub.   No murmur heard. Pulmonary/Chest: Effort normal and breath sounds normal. No respiratory distress.  Abdominal: Soft. Normal appearance and bowel sounds are normal. She exhibits no distension and no mass. There is tenderness in the left lower quadrant. There is no rigidity, no rebound and no guarding.    Genitourinary:  Patient had recent pelvic exam with STD testing  Neurological: She is alert and oriented to person, place, and time. She exhibits normal muscle tone. Coordination normal.  Skin: Skin is warm and dry.    ED Course  Procedures (including critical care time)  Labs Reviewed  CBC WITH DIFFERENTIAL - Abnormal; Notable for the following:    Neutrophils Relative % 42 (*)    Lymphocytes Relative 47 (*)    All other components within normal limits  URINALYSIS, ROUTINE W REFLEX MICROSCOPIC - Abnormal; Notable for the following:    APPearance CLOUDY (*)    Hgb urine dipstick TRACE (*)    All other components within normal limits  URINE MICROSCOPIC-ADD ON - Abnormal; Notable for the following:    Squamous Epithelial / LPF FEW (*)    Bacteria, UA FEW (*)    All other components within normal limits  COMPREHENSIVE METABOLIC  PANEL  CBC WITH DIFFERENTIAL  BASIC METABOLIC PANEL  POCT PREGNANCY, URINE   Patient has an appointment tomorrow with her GYN.  Advised the patient to return here as needed.  Patient is advised to increase her fluid intake.  Upon reviewing her previous records, patient has negative STD testing.  I explained the results of her testing and answered  All questions  1. Complex ovarian cyst       MDM          Carlyle Dolly, PA-C 08/04/12 [phone]

## 2013-03-01 NOTE — L&D Delivery Note (Signed)
Delivery Note Kyliyah pushed for 20 minutes.  At 6:08 AM a viable female was delivered via Vaginal, Spontaneous Delivery (Presentation: ; Occiput Anterior).   APGARs, weight pending.   Placenta status: Intact, Spontaneous Pathology.  Cord: 3 vessels with the following complications: None.  Cord pH: n/a  Anesthesia: None  Episiotomy: None Lacerations: 1st degree Suture Repair: 3.0 vicryl rapide Est. Blood Loss (mL):  300cc  Mom to postpartum.  Baby to Couplet care / Skin to Skin.  Marlow BaarsCLARK, Davonta Stroot 02/25/2014, 6:45 AM

## 2013-05-23 IMAGING — CT CT ABD-PELV W/ CM
1 of 4 series · 15 of 36 positions shown, 19 images · IV contrast (READICAT/WATER & [ID] OMNI 300)
Comparison: CT abdomen pelvis of 11/08/2010

CLINICAL DATA: Right-sided abdominal pain for 5 months, history of
ulcerative colitis, dark stools, fever

CT ABDOMEN AND PELVIS WITH CONTRAST
TECHNIQUE: Multidetector CT imaging of the abdomen and pelvis was
performed following the standard protocol during bolus
administration of intravenous contrast.
Contrast: 100mL OMNIPAQUE IOHEXOL 300 MG/ML  SOLN

[Series 2: abd/pelvis with · axial · 0.62mm/px · z∈[-386,-16]mm · 15 of 82 slices shown, 19 images]
[im 4/82  soft-tissue]
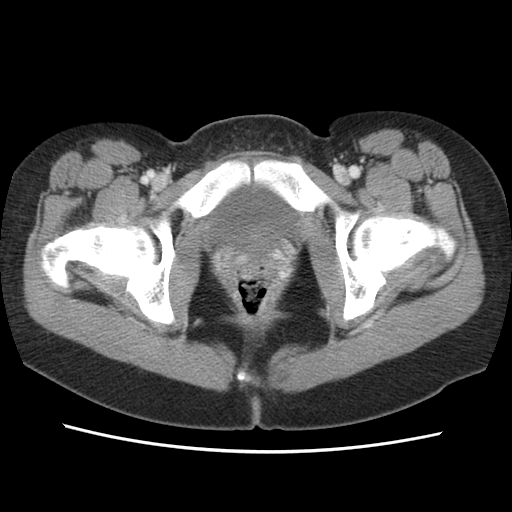
[im 4/82  bone]
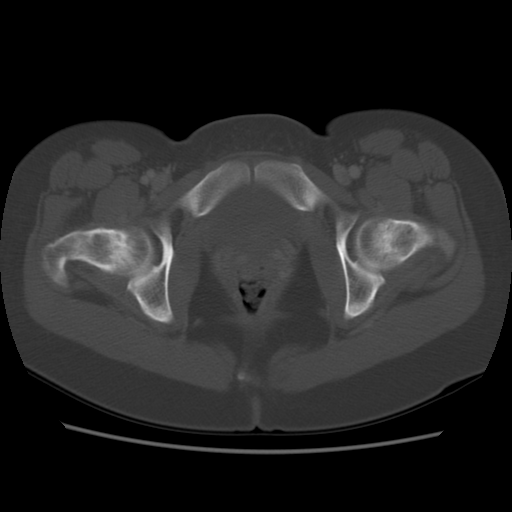
[im 11/82  soft-tissue]
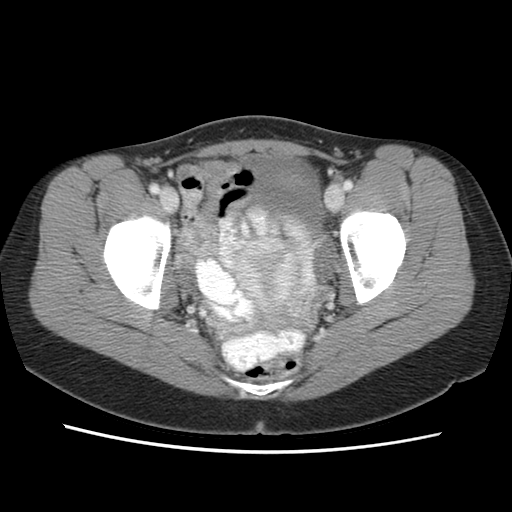
[im 17/82  soft-tissue]
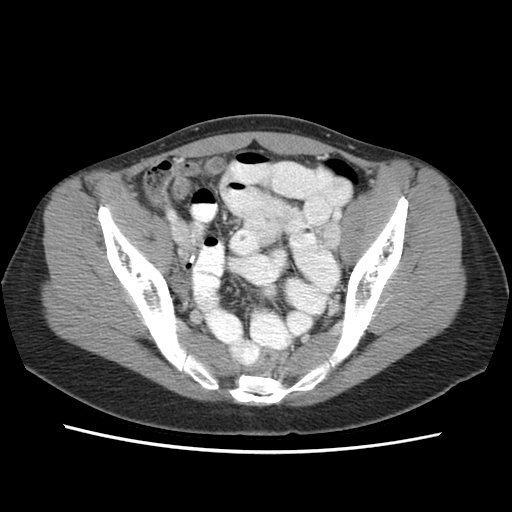
[im 24/82  soft-tissue]
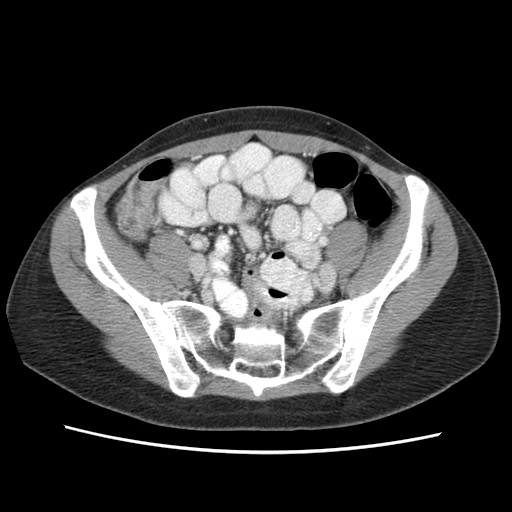
[im 28/82  soft-tissue]
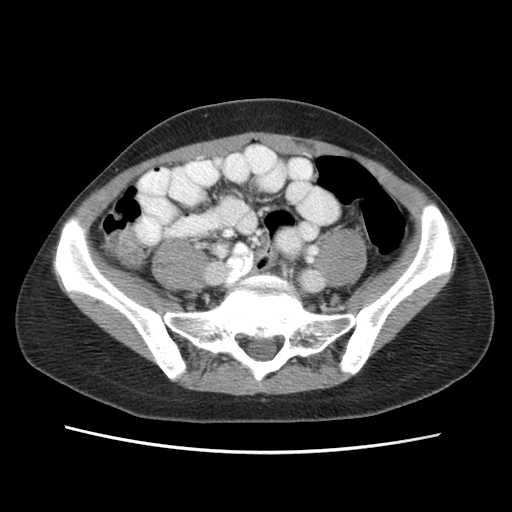
[im 34/82  soft-tissue]
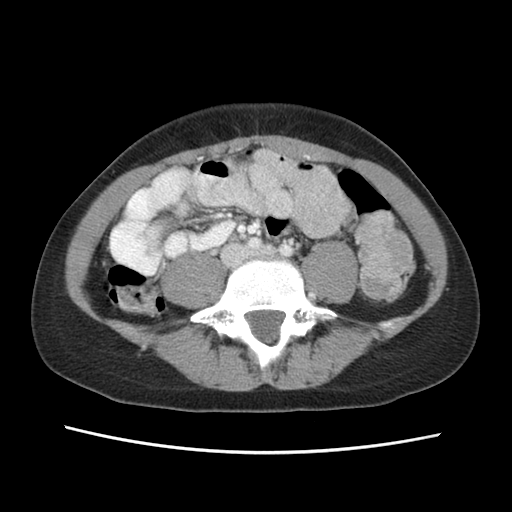
[im 41/82  soft-tissue]
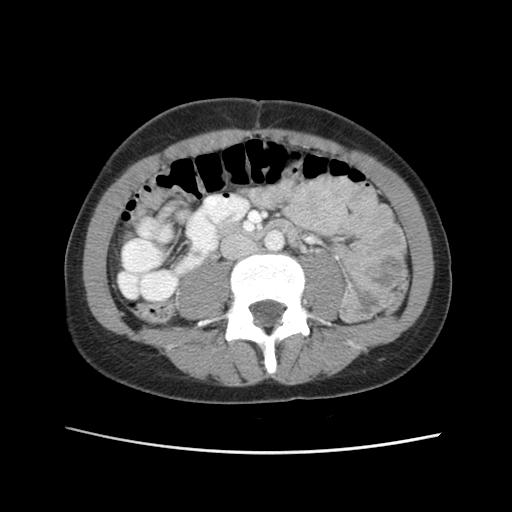
[im 48/82  soft-tissue]
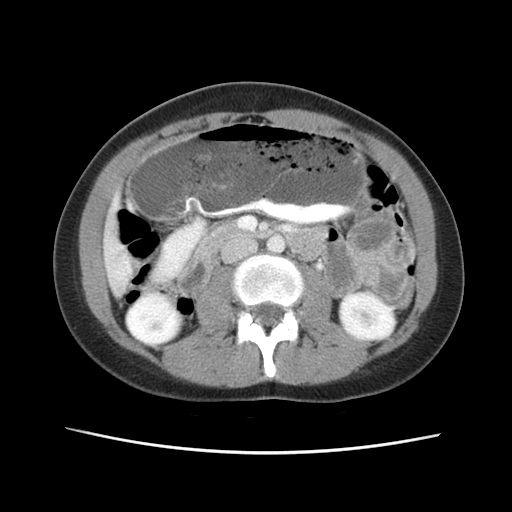
[im 55/82  soft-tissue]
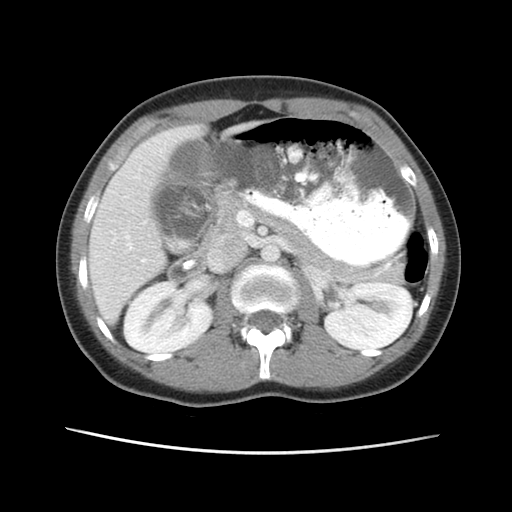
[im 55/82  bone]
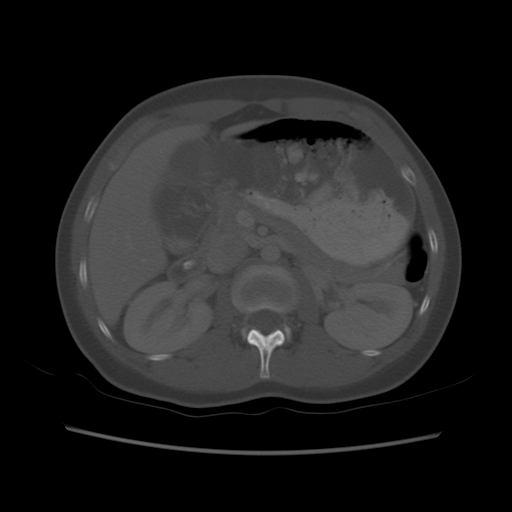
[im 58/82  soft-tissue]
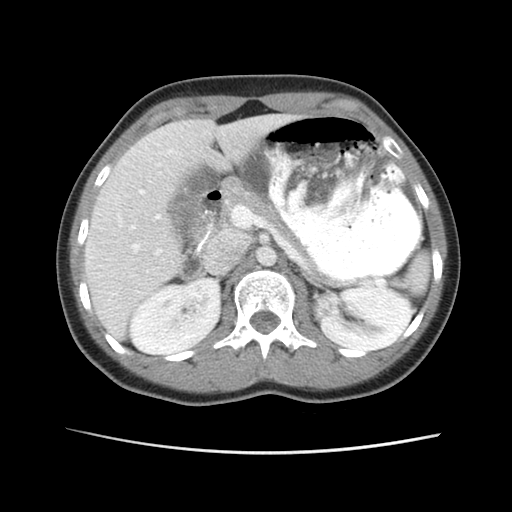
[im 65/82  soft-tissue]
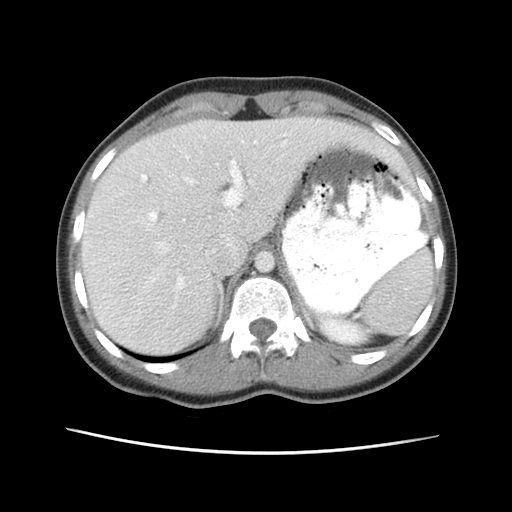
[im 68/82  lung]
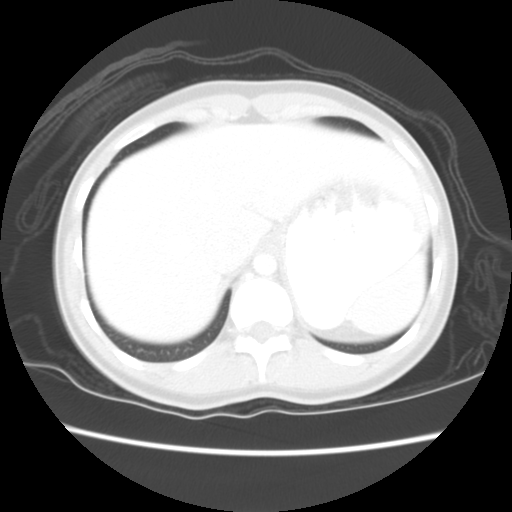
[im 71/82  soft-tissue]
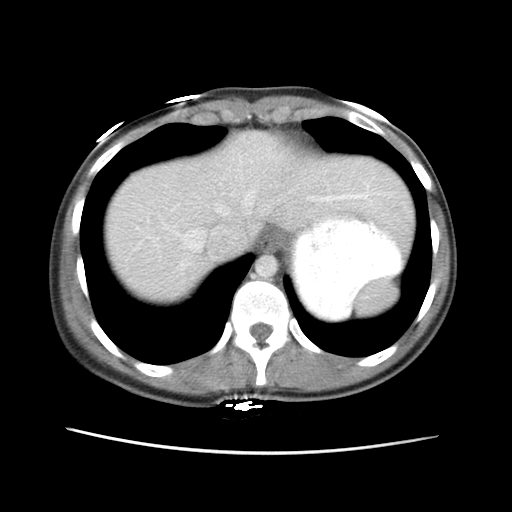
[im 71/82  lung]
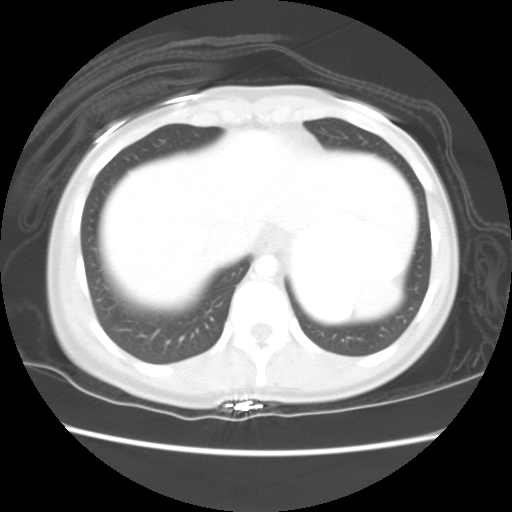
[im 75/82  lung]
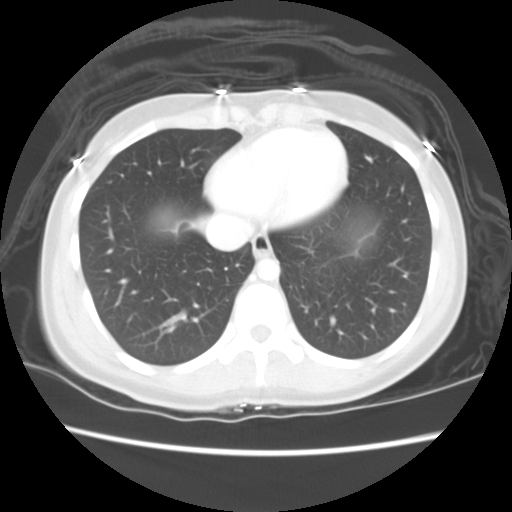
[im 78/82  soft-tissue]
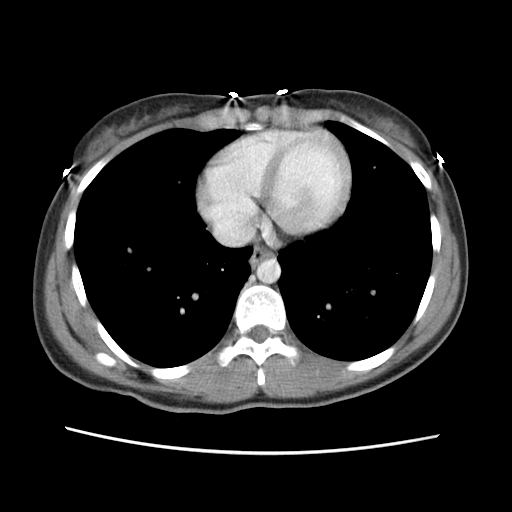
[im 78/82  lung]
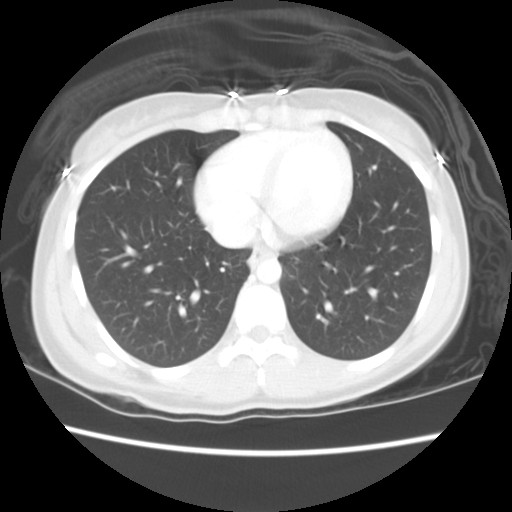

[15 of 36 positions shown; findings below may reference images not displayed]

FINDINGS: The lung bases are clear.  The liver enhances with no
focal abnormality and no ductal dilatation is seen.  The
gallbladder is not definitely seen and may be contracted.  The
pancreas is normal in size and the pancreatic duct is not dilated.
The adrenal glands and spleen are unremarkable.  The stomach is
moderately fluid distended with no abnormality noted.  The kidneys
enhance with no calculus or mass and no hydronephrosis is seen.
The abdominal aorta is normal in caliber.  No adenopathy is noted.

The colon is largely decompressed but there is no evidence of
colonic mass or edema.  No distention of small bowel is seen, with
the small bowel relatively well opacified with oral contrast.  The
urinary bladder is moderately well distended with urine and is
unremarkable.  The uterus is normal in size being somewhat
anteverted.  A probable collapsing cyst is noted on the right
ovary.  No free fluid is seen within the pelvis.  The terminal
ileum is not well opacified with contrast but appears unremarkable
on the coronal images.  The SI joints appear normally corticated.
No bony abnormality is seen.  There does appear to be a left-sided
pars defect at L5 with normal alignment maintained.
IMPRESSION: 1.  No abnormality of the colon or small bowel is seen.  The colon
is largely decompressed but no edema is noted.
2.  Probable collapsing right ovarian cyst.  No free fluid.
3.  Left-sided pars defect at L5.

## 2013-06-15 ENCOUNTER — Other Ambulatory Visit: Payer: Self-pay | Admitting: Family Medicine

## 2013-06-15 ENCOUNTER — Other Ambulatory Visit (HOSPITAL_COMMUNITY)
Admission: RE | Admit: 2013-06-15 | Discharge: 2013-06-15 | Disposition: A | Discharge status: Home / Self Care | Payer: BC Managed Care – PPO | Source: Ambulatory Visit | Attending: Family Medicine | Admitting: Family Medicine

## 2013-06-15 ENCOUNTER — Ambulatory Visit
Admission: RE | Admit: 2013-06-15 | Discharge: 2013-06-15 | Disposition: A | Discharge status: Home / Self Care | Payer: BC Managed Care – PPO | Source: Ambulatory Visit | Attending: Family Medicine | Admitting: Family Medicine

## 2013-06-15 DIAGNOSIS — R042 Hemoptysis: Secondary | ICD-10-CM

## 2013-06-15 DIAGNOSIS — Z124 Encounter for screening for malignant neoplasm of cervix: Secondary | ICD-10-CM | POA: Insufficient documentation

## 2013-07-19 ENCOUNTER — Ambulatory Visit (INDEPENDENT_AMBULATORY_CARE_PROVIDER_SITE_OTHER): Payer: BC Managed Care – PPO | Admitting: Family Medicine

## 2013-07-19 VITALS — BP 108/70 | HR 80 | Temp 98.1°F | Resp 18 | Ht 66.5 in | Wt 124.0 lb

## 2013-07-19 DIAGNOSIS — R21 Rash and other nonspecific skin eruption: Secondary | ICD-10-CM

## 2013-07-19 DIAGNOSIS — B9689 Other specified bacterial agents as the cause of diseases classified elsewhere: Secondary | ICD-10-CM

## 2013-07-19 DIAGNOSIS — N760 Acute vaginitis: Secondary | ICD-10-CM

## 2013-07-19 DIAGNOSIS — N912 Amenorrhea, unspecified: Secondary | ICD-10-CM

## 2013-07-19 DIAGNOSIS — Z3490 Encounter for supervision of normal pregnancy, unspecified, unspecified trimester: Secondary | ICD-10-CM

## 2013-07-19 DIAGNOSIS — Z331 Pregnant state, incidental: Secondary | ICD-10-CM

## 2013-07-19 DIAGNOSIS — N898 Other specified noninflammatory disorders of vagina: Secondary | ICD-10-CM

## 2013-07-19 LAB — POCT WET PREP WITH KOH
KOH PREP POC: NEGATIVE
RBC Wet Prep HPF POC: NEGATIVE
TRICHOMONAS UA: NEGATIVE
YEAST WET PREP PER HPF POC: NEGATIVE

## 2013-07-19 LAB — POCT URINE PREGNANCY: PREG TEST UR: POSITIVE

## 2013-07-19 MED ORDER — METRONIDAZOLE 250 MG PO TABS: 250.0000 mg | ORAL_TABLET | Freq: Three times a day (TID) | ORAL | Status: DC
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
## 2013-07-19 NOTE — Progress Notes (Addendum)
This chart was scribed for Ethelda ChickKristi M Avaley Coop, MD by Tana ConchStephen Methvin, ED Scribe. This patient was seen in room 13 and the patient's care was started at 3:55 PM .  Subjective:    Patient ID: Christina Young, female    DOB: 04/17/1990, 23 y.o.   MRN: [id number]  HPI  HPI Comments: Christina Young is a 23 y.o. female who presents to the [facility] complaining of vaginal discharge that began when she washed with Dial bar soap, the discharge is white. The discharge was accompanied by a rash, but the rash has since resolved when she took benadryl. She reports that her friend had used a similar soap and had a similar breakout. Pt took had a positive pregnancy test on Sunday.  Pt had been on birth control and used condoms. She has forgotten her birth control before. Pt reports that the last time she had sex was last month before her period. She does not have children. Pt is working full time for [employer], she graduated from [employer]. She denies abdominal pain.  Pt would like her pregnancy test confirmed.  [place].  LMNP is last month, April 4-9 and was normal..   Review of Systems  Constitutional: Negative for fever, chills, diaphoresis and fatigue.  Gastrointestinal: Negative for nausea, vomiting, abdominal pain and abdominal distention.  Genitourinary: Positive for vaginal discharge. Negative for dysuria, urgency, frequency, hematuria, flank pain, vaginal bleeding, genital sores, vaginal pain, menstrual problem and pelvic pain.  Skin: Positive for rash.       Objective:   Physical Exam  Constitutional: She is oriented to person, place, and time. She appears well-developed and well-nourished. No distress.  HENT:  Head: Normocephalic.  Eyes: Conjunctivae and EOM are normal. Pupils are equal, round, and reactive to light. No scleral icterus.  Neck: Neck supple. No thyromegaly present.  Cardiovascular: Normal rate and regular rhythm.  Exam reveals no gallop and no  friction rub.   No murmur heard. Pulmonary/Chest: No stridor. She has no wheezes. She has no rales. She exhibits no tenderness.  Abdominal: Soft. Bowel sounds are normal. She exhibits no distension and no mass. There is no tenderness. There is no rebound and no guarding.  Genitourinary: Uterus normal. There is no rash, tenderness or lesion on the right labia. There is no rash, tenderness or lesion on the left labia. Cervix exhibits discharge. Cervix exhibits no motion tenderness and no friability. Right adnexum displays no mass, no tenderness and no fullness. Left adnexum displays no mass, no tenderness and no fullness. No erythema or bleeding in the vagina. No foreign body around the vagina. Vaginal discharge found.  Musculoskeletal: Normal range of motion. She exhibits no edema.  Lymphadenopathy:    She has no cervical adenopathy.  Neurological: She is oriented to person, place, and time. She exhibits normal muscle tone. Coordination normal.  Skin: No rash noted. She is not diaphoretic. No erythema.  Psychiatric: She has a normal mood and affect. Her behavior is normal.    Filed Vitals:   07/19/13 1416  BP: 108/70  Pulse: 80  Temp: 98.1 F (36.7 C)  Resp: 18   Results for orders placed or performed in visit on 07/19/13  GC/Chlamydia Probe Amp  Result Value Ref Range   CT Probe RNA NEGATIVE    GC Probe RNA NEGATIVE   POCT Wet Prep with KOH  Result Value Ref Range   Trichomonas, UA Negative    Clue Cells Wet Prep HPF  POC 50%    Epithelial Wet Prep HPF POC 1-15    Yeast Wet Prep HPF POC neg    Bacteria Wet Prep HPF POC trace    RBC Wet Prep HPF POC neg    WBC Wet Prep HPF POC 1-5    KOH Prep POC Negative   POCT urine pregnancy  Result Value Ref Range   Preg Test, Ur Positive        Assessment & Plan:  Amenorrhea - Plan: POCT Wet Prep with KOH, GC/Chlamydia Probe Amp, POCT urine pregnancy  Vaginal discharge - Plan: POCT Wet Prep with KOH, GC/Chlamydia Probe  Amp  Pregnancy  BV (bacterial vaginosis)  Rash   1. Bacterial Vaginosis:  New. Rx for Metronidazole 250mg  tid provided.   2.  Pregnancy: New.  Anticipatory guidance provided; recommend establishing with gynecologist in town if desires to keep pregnancy.  Discussed various options including abortion, adoption, etc.  Recommend PNV daily. 3. Rash: New. Contact dermatitis that is nearly resolved since benadryl; avoid re-exposure to new soap.  Meds ordered this encounter  Medications  . DISCONTD: metroNIDAZOLE (FLAGYL) 250 MG tablet    Sig: Take 1 tablet (250 mg total) by mouth 3 (three) times daily.    Dispense:  21 tablet    Refill:  0     I personally performed the services described in this documentation, which was scribed in my presence. The recorded information has been reviewed and is accurate.  Nilda SimmerKristi Godwin Tedesco, M.D.  [facility]  [place] [address] [place], [place]  [address] [phone] phone [phone] fax

## 2013-07-20 LAB — GC/CHLAMYDIA PROBE AMP
CT Probe RNA: NEGATIVE
GC Probe RNA: NEGATIVE

## 2013-08-01 ENCOUNTER — Telehealth: Payer: Self-pay | Admitting: *Deleted

## 2013-08-01 NOTE — Telephone Encounter (Signed)
Dr Katrinka Blazing  Is it ok to print patient's letter?  Please advise.

## 2013-08-01 NOTE — Telephone Encounter (Signed)
Pt saw Dr. Katrinka Blazing and had a positive pregnancy diagnosis. She needs to use the restroom frequently at work and she needs a note from a doctor stating that this is medically necessary. Please call [phone]

## 2013-08-01 NOTE — Telephone Encounter (Signed)
Yes, OK to write note stating that patient may require frequent restroom breaks during the work day due to pregnancy.

## 2013-08-02 ENCOUNTER — Telehealth: Payer: Self-pay

## 2013-08-02 NOTE — Telephone Encounter (Signed)
Letter written. In pick up drawer- pt advised

## 2013-08-02 NOTE — Telephone Encounter (Signed)
Pt states that her job would like a note confirming that she is pregnant and the due date. She would also like for the note authorize for her to have more than 30 minutes for lunch and frequent bathroom breaks. Best# [phone]

## 2013-08-03 ENCOUNTER — Encounter: Payer: Self-pay | Admitting: *Deleted

## 2013-08-03 NOTE — Telephone Encounter (Signed)
Pregnancy confirmed 5/21 visit. Please advise the estimated due date I can type up a letter for her.

## 2013-08-07 NOTE — Telephone Encounter (Signed)
Call -- 1. Note has been completed; where can we fax note?  2.  I have authorized 1-2 additional bathroom breaks per day; however, she does not need more than 30 minutes for lunch.

## 2013-08-07 NOTE — Telephone Encounter (Signed)
Pt picked up note- does not need to be faxed.

## 2013-08-20 ENCOUNTER — Encounter (HOSPITAL_COMMUNITY): Payer: Self-pay | Admitting: *Deleted

## 2013-08-20 ENCOUNTER — Inpatient Hospital Stay (HOSPITAL_COMMUNITY)
Admission: AD | Admit: 2013-08-20 | Discharge: 2013-08-20 | Disposition: A | Discharge status: Home / Self Care | Payer: BC Managed Care – PPO | Source: Ambulatory Visit | Attending: Obstetrics & Gynecology | Admitting: Obstetrics & Gynecology

## 2013-08-20 DIAGNOSIS — O99891 Other specified diseases and conditions complicating pregnancy: Secondary | ICD-10-CM | POA: Insufficient documentation

## 2013-08-20 DIAGNOSIS — O9989 Other specified diseases and conditions complicating pregnancy, childbirth and the puerperium: Principal | ICD-10-CM

## 2013-08-20 DIAGNOSIS — R51 Headache: Secondary | ICD-10-CM | POA: Diagnosis present

## 2013-08-20 MED ORDER — BUTALBITAL-APAP-CAFFEINE 50-325-40 MG PO TABS: 1.0000 | ORAL_TABLET | Freq: Four times a day (QID) | ORAL | Status: DC | PRN

## 2013-08-20 MED ORDER — BUTALBITAL-APAP-CAFFEINE 50-325-40 MG PO TABS
1.0000 | ORAL_TABLET | Freq: Once | ORAL | Status: AC
  Administered 2013-08-20: 1 via ORAL
  Filled 2013-08-20: qty 1

## 2013-08-20 NOTE — MAU Provider Note (Signed)
History     CSN: [id number]  Arrival date and time: 08/20/13 2046   First Provider Initiated Contact with Patient 08/20/13 2138      Chief Complaint  Patient presents with  . Headache   HPI Ms. Christina Young is a 23 y.o. G2P0010 at 230w1d who presents to MAU today with complain of headache since last night. She states 1 episode of N/V this morning and then normal PO intake throughout the day. She took 1/2 of a 500 mg Tylenol last night and another 1/2 today without relief. She states occasional mild lower abdominal cramping. She denies vaginal bleeding or fever. She states occasional positional dizziness with change of positions. She rates her headache pain at 9/10 now.    OB History   Grav Para Term Preterm Abortions TAB SAB Ect Mult Living   2    1           Past Medical History  Diagnosis Date  . Ulcerative colitis   . Physiological ovarian cysts   . Headache(784.0)     History reviewed. No pertinent past surgical history.  Family History  Problem Relation Age of Onset  . Hypertension Father     History  Substance Use Topics  . Smoking status: Never Smoker   . Smokeless tobacco: Not on file  . Alcohol Use: No    Allergies: No Known Allergies  Prescriptions prior to admission  Medication Sig Dispense Refill  . acetaminophen (TYLENOL) 500 MG tablet Take 500 mg by mouth every 6 (six) hours as needed for pain.      . metroNIDAZOLE (FLAGYL) 250 MG tablet Take 1 tablet (250 mg total) by mouth 3 (three) times daily.  21 tablet  0  . oxyCODONE-acetaminophen (PERCOCET/ROXICET) 5-325 MG per tablet Take 2 tablets by mouth every 6 (six) hours as needed for pain.  15 tablet  0  . promethazine (PHENERGAN) 25 MG tablet Take 1 tablet (25 mg total) by mouth every 6 (six) hours as needed for nausea.  12 tablet  0    Review of Systems  Constitutional: Negative for fever and malaise/fatigue.  Eyes: Negative for blurred vision.  Gastrointestinal: Positive for nausea, vomiting  and abdominal pain. Negative for diarrhea and constipation.  Genitourinary:       Neg -vaginal bleeding  Neurological: Positive for dizziness and headaches. Negative for loss of consciousness.   Physical Exam   Blood pressure 106/63, pulse 92, temperature 99.1 F (37.3 C), temperature source Oral, resp. rate 20, height 5\' 5"  (1.651 m), weight 129 lb 2 oz (58.571 kg), last menstrual period 06/07/2013.  Physical Exam  Constitutional: She is oriented to person, place, and time. She appears well-developed and well-nourished. No distress.  Patient appears very comfortable  HENT:  Head: Normocephalic.  Cardiovascular: Normal rate.   Respiratory: Effort normal.  Neurological: She is alert and oriented to person, place, and time.  Skin: Skin is warm and dry. No erythema.  Psychiatric: She has a normal mood and affect.    MAU Course  Procedures None  MDM FHR - 177 bpm with doppler Patient given Fioricet in MAU today for headache  Assessment and Plan  A: SIUP at 1870w1d Headache  P: Discharge home Rx for Fioricet given to patient Increased PO hydration advised Patient advised to follow-up with [facility] as scheduled to resume prenatal care Patient may return to MAU as needed or if her condition were to change or worsen   Freddi StarrJulie N Ethier, PA-C  08/20/2013, 10:56 PM

## 2013-08-20 NOTE — MAU Note (Addendum)
PT  SAYS SHE STARTED GETTING H/A YESTERDAY.  TOOK TYLENOL  YESTERDAY  AND TODAY-  225MG .    CALLED DR Cathleen FearsWILCOX IN [place]-  TOLD HER TO TAKE TYLENOL. HAS TRANSFERRED HER CARE TO CLINIC  DOWNSTAIRS-   HAS AN APPOINTMENT ON 7-20.      HAS HX OF H/A-  MIGRAINES   -  IN FOREHEAD.  VOMITED THIS AM

## 2013-08-20 NOTE — Discharge Instructions (Signed)

## 2013-08-28 NOTE — MAU Provider Note (Signed)
Attestation of Attending Supervision of Advanced Practitioner (CNM/NP): Evaluation and management procedures were performed by the Advanced Practitioner under my supervision and collaboration. I have reviewed the Advanced Practitioner's note and chart, and I agree with the management and plan.  LEGGETT,KELLY H. 6:43 AM   

## 2013-09-11 ENCOUNTER — Inpatient Hospital Stay (HOSPITAL_COMMUNITY)
Admission: AD | Admit: 2013-09-11 | Discharge: 2013-09-11 | Disposition: A | Discharge status: Home / Self Care | Payer: BC Managed Care – PPO | Source: Ambulatory Visit | Attending: Obstetrics and Gynecology | Admitting: Obstetrics and Gynecology

## 2013-09-11 ENCOUNTER — Encounter (HOSPITAL_COMMUNITY): Payer: Self-pay | Admitting: *Deleted

## 2013-09-11 DIAGNOSIS — O209 Hemorrhage in early pregnancy, unspecified: Secondary | ICD-10-CM | POA: Insufficient documentation

## 2013-09-11 DIAGNOSIS — O219 Vomiting of pregnancy, unspecified: Secondary | ICD-10-CM

## 2013-09-11 LAB — URINALYSIS, ROUTINE W REFLEX MICROSCOPIC
Bilirubin Urine: NEGATIVE
Glucose, UA: NEGATIVE mg/dL
Hgb urine dipstick: NEGATIVE
KETONES UR: NEGATIVE mg/dL
LEUKOCYTES UA: NEGATIVE
NITRITE: NEGATIVE
PH: 6 (ref 5.0–8.0)
Protein, ur: NEGATIVE mg/dL
Specific Gravity, Urine: 1.01 (ref 1.005–1.030)
Urobilinogen, UA: 0.2 mg/dL (ref 0.0–1.0)

## 2013-09-11 MED ORDER — ONDANSETRON HCL 4 MG PO TABS
8.0000 mg | ORAL_TABLET | Freq: Once | ORAL | Status: AC
  Administered 2013-09-11: 8 mg via ORAL
  Filled 2013-09-11: qty 2

## 2013-09-11 MED ORDER — ONDANSETRON 4 MG PO TBDP: 4.0000 mg | ORAL_TABLET | Freq: Three times a day (TID) | ORAL | Status: DC | PRN

## 2013-09-11 NOTE — MAU Provider Note (Signed)
None     Chief Complaint:  Emesis During Pregnancy   Christina Young is  23 y.o. G2P0010 at 4028w3d presents complaining of Emesis During Pregnancy pt was recently seen for abdominal pain and constipation since resolved. However, woke this AM with inability to tolerate solid foods and threw up immediately after eating. Currently not feeling nauseated, +hunger, pt has had morning sickness but this is more intense. No sick contacts.   No lof, no vb, no ctx, +flutters  No f/c, no d/c, no abd pain, no ha, no vision problems, no cp, no sob  Obstetrical/Gynecological History: OB History   Grav Para Term Preterm Abortions TAB SAB Ect Mult Living   2    1     0     Past Medical History: Past Medical History  Diagnosis Date  . Ulcerative colitis   . Physiological ovarian cysts   . ZOXWRUEA(540.9Headache(784.0)     Past Surgical History: History reviewed. No pertinent past surgical history.  Family History: Family History  Problem Relation Age of Onset  . Hypertension Father     Social History: History  Substance Use Topics  . Smoking status: Never Smoker   . Smokeless tobacco: Not on file  . Alcohol Use: No    Allergies: No Known Allergies  Meds:  Prescriptions prior to admission  Medication Sig Dispense Refill              0       0       0       0     Physical Exam  Blood pressure 119/65, pulse 88, temperature 99.1 F (37.3 C), temperature source Oral, resp. rate 18, height 5\' 6"  (1.676 m), weight 59.966 kg (132 lb 3.2 oz), last menstrual period 06/09/2013. GENERAL: Well-developed, well-nourished female in no acute distress.  LUNGS: Clear to auscultation bilaterally.  HEART: Regular rate and rhythm. ABDOMEN: Soft, nontender, nondistended, gravid.  EXTREMITIES: Nontender, no edema, 2+ distal pulses. DTR's 2+ FHT:  Baseline rate 164 bpm   Labs: No results found for this or any previous visit (from the past 24 hour(s)). Imaging Studies:  No results  found.  Assessment: Christina Young is  23 y.o. G2P0010 at 8928w3d presents with nausea and vomitting of 1 day, no change in diet, possible viral etiology. Improved with zofran, tolerated PO and stable for dischare, UA Reassuring . F/u in clinic on 20 as previously scheduled for new OB Zofran 4mg  PO #15 for sx releif (discussed risks and benefits and patient does not want to be drowsY)  Christina Young RYAN 7/14/20156:51 PM

## 2013-09-11 NOTE — Discharge Instructions (Signed)

## 2013-09-11 NOTE — MAU Note (Signed)
Has felt bad since last Thursday, started vomiting today.  Seen @ Urgent Care on Sunday for constipation & back pain.  Has increased fiber in diet, having BM's now.  Denies pain @ this time.

## 2013-09-13 NOTE — MAU Provider Note (Signed)
Attestation of Attending Supervision of Advanced Practitioner (CNM/NP): Evaluation and management procedures were performed by the Advanced Practitioner under my supervision and collaboration.  I have reviewed the Advanced Practitioner's note and chart, and I agree with the management and plan.  Barby Colvard 09/13/2013 9:33 AM

## 2013-09-17 ENCOUNTER — Inpatient Hospital Stay (HOSPITAL_COMMUNITY)
Admission: AD | Admit: 2013-09-17 | Discharge: 2013-09-17 | Disposition: A | Discharge status: Home / Self Care | Payer: BC Managed Care – PPO | Source: Ambulatory Visit | Attending: Obstetrics & Gynecology | Admitting: Obstetrics & Gynecology

## 2013-09-17 ENCOUNTER — Encounter (HOSPITAL_COMMUNITY): Payer: Self-pay | Admitting: *Deleted

## 2013-09-17 DIAGNOSIS — K5190 Ulcerative colitis, unspecified, without complications: Secondary | ICD-10-CM | POA: Diagnosis not present

## 2013-09-17 DIAGNOSIS — O99612 Diseases of the digestive system complicating pregnancy, second trimester: Secondary | ICD-10-CM

## 2013-09-17 DIAGNOSIS — O99891 Other specified diseases and conditions complicating pregnancy: Secondary | ICD-10-CM | POA: Diagnosis not present

## 2013-09-17 DIAGNOSIS — K5900 Constipation, unspecified: Secondary | ICD-10-CM | POA: Insufficient documentation

## 2013-09-17 DIAGNOSIS — O9989 Other specified diseases and conditions complicating pregnancy, childbirth and the puerperium: Principal | ICD-10-CM

## 2013-09-17 MED ORDER — FLEET ENEMA 7-19 GM/118ML RE ENEM
1.0000 | ENEMA | Freq: Once | RECTAL | Status: AC
  Administered 2013-09-17: 1 via RECTAL

## 2013-09-17 MED ORDER — DOCUSATE SODIUM 100 MG PO CAPS: 100.0000 mg | ORAL_CAPSULE | Freq: Two times a day (BID) | ORAL | Status: AC

## 2013-09-17 NOTE — MAU Note (Signed)
Pt c/o constipation that is causing abdominal pain. States that she gets very hot and sweaty trying to use the bathroom. Has been straining and feels like "somethings on the edge of her rectum." Last BM was yesterday but it was very small, hard balls and felt no relief. Drank prune juice to try to help.

## 2013-09-17 NOTE — MAU Provider Note (Signed)
History     CSN: [id number]  Arrival date and time: 09/17/13 [id number]   First Provider Initiated Contact with Patient 09/17/13 0423      Chief Complaint  Patient presents with  . Constipation   HPI  Christina Young is a 23 y.o. G2P0010 at 3162w2d who presents today with constipation. She states that she had a BM yesterday, but that it was hard and formed. She has an appointment with the clinic today at 1500. She denies any VB or OB complaints.   Past Medical History  Diagnosis Date  . Ulcerative colitis   . Physiological ovarian cysts   . Headache(784.0)     History reviewed. No pertinent past surgical history.  Family History  Problem Relation Age of Onset  . Hypertension Father     History  Substance Use Topics  . Smoking status: Never Smoker   . Smokeless tobacco: Not on file  . Alcohol Use: No    Allergies: No Known Allergies  Prescriptions prior to admission  Medication Sig Dispense Refill  . butalbital-acetaminophen-caffeine (FIORICET, ESGIC) 50-325-40 MG per tablet Take by mouth 2 (two) times daily as needed for headache.      . ondansetron (ZOFRAN ODT) 4 MG disintegrating tablet Take 1 tablet (4 mg total) by mouth every 8 (eight) hours as needed for nausea or vomiting.  20 tablet  0  . Prenatal Vit-Fe Fumarate-FA (PRENATAL MULTIVITAMIN) TABS tablet Take 1 tablet by mouth daily at 12 noon.        ROS Physical Exam   Blood pressure 110/65, pulse 97, temperature 98.5 F (36.9 C), resp. rate 18, height 5\' 5"  (1.651 m), weight 60.691 kg (133 lb 12.8 oz), last menstrual period 06/09/2013, SpO2 99.00%.  Physical Exam  Nursing note and vitals reviewed. Constitutional: She is oriented to person, place, and time. She appears well-developed and well-nourished. No distress.  Cardiovascular: Normal rate.   Respiratory: Effort normal and breath sounds normal.  GI: Soft. Bowel sounds are normal. There is no tenderness. There is no rebound.  Neurological: She is alert  and oriented to person, place, and time.  Skin: Skin is warm and dry.  Psychiatric: She has a normal mood and affect.    MAU Course  Procedures 0425: Patient has had results with an enema here. She reports feeling much better.   Assessment and Plan   1. Constipation during pregnancy in second trimester   Increase fiber and fluids 2nd trimester precautions reviewed Return to MAU as needed  Follow-up Information   [facility] (As scheduled)    Specialty:  Obstetrics and Gynecology   Contact information:   [address] [place] [address] [phone]        Medication List         butalbital-acetaminophen-caffeine 50-325-40 MG per tablet  Commonly known as:  FIORICET, ESGIC  Take by mouth 2 (two) times daily as needed for headache.     docusate sodium 100 MG capsule  Commonly known as:  COLACE  Take 1 capsule (100 mg total) by mouth 2 (two) times daily.     ondansetron 4 MG disintegrating tablet  Commonly known as:  ZOFRAN ODT  Take 1 tablet (4 mg total) by mouth every 8 (eight) hours as needed for nausea or vomiting.     prenatal multivitamin Tabs tablet  Take 1 tablet by mouth daily at 12 noon.         Tawnya CrookHogan, Heather Donovan 09/17/2013, 4:25 AM

## 2013-09-17 NOTE — Discharge Instructions (Signed)

## 2013-09-17 NOTE — MAU Note (Signed)
Pt reports constipation. Pt states that she is breaking out into a sweat because she feels like "something has to come out and it can't" Pt has an appointment to day in clinic.

## 2013-09-20 ENCOUNTER — Other Ambulatory Visit (HOSPITAL_COMMUNITY): Payer: Self-pay | Admitting: Nurse Practitioner

## 2013-09-20 DIAGNOSIS — Z3689 Encounter for other specified antenatal screening: Secondary | ICD-10-CM

## 2013-10-18 ENCOUNTER — Ambulatory Visit (HOSPITAL_COMMUNITY)
Admission: RE | Admit: 2013-10-18 | Discharge: 2013-10-18 | Disposition: A | Discharge status: Home / Self Care | Payer: BC Managed Care – PPO | Source: Ambulatory Visit | Attending: Nurse Practitioner | Admitting: Nurse Practitioner

## 2013-10-18 DIAGNOSIS — Z3689 Encounter for other specified antenatal screening: Secondary | ICD-10-CM | POA: Diagnosis present

## 2013-10-18 DIAGNOSIS — Z1389 Encounter for screening for other disorder: Secondary | ICD-10-CM

## 2013-10-18 DIAGNOSIS — Z363 Encounter for antenatal screening for malformations: Secondary | ICD-10-CM | POA: Insufficient documentation

## 2013-10-22 ENCOUNTER — Other Ambulatory Visit (HOSPITAL_COMMUNITY): Payer: Self-pay | Admitting: Nurse Practitioner

## 2013-10-22 DIAGNOSIS — IMO0002 Reserved for concepts with insufficient information to code with codable children: Secondary | ICD-10-CM

## 2013-10-22 DIAGNOSIS — Z0489 Encounter for examination and observation for other specified reasons: Secondary | ICD-10-CM

## 2013-10-23 ENCOUNTER — Inpatient Hospital Stay (HOSPITAL_COMMUNITY)
Admission: AD | Admit: 2013-10-23 | Discharge: 2013-10-23 | Disposition: A | Discharge status: Home / Self Care | Payer: BC Managed Care – PPO | Source: Ambulatory Visit | Attending: Obstetrics & Gynecology | Admitting: Obstetrics & Gynecology

## 2013-10-23 ENCOUNTER — Encounter (HOSPITAL_COMMUNITY): Payer: Self-pay | Admitting: *Deleted

## 2013-10-23 DIAGNOSIS — O99891 Other specified diseases and conditions complicating pregnancy: Secondary | ICD-10-CM | POA: Diagnosis not present

## 2013-10-23 DIAGNOSIS — O9989 Other specified diseases and conditions complicating pregnancy, childbirth and the puerperium: Principal | ICD-10-CM

## 2013-10-23 DIAGNOSIS — R109 Unspecified abdominal pain: Secondary | ICD-10-CM | POA: Insufficient documentation

## 2013-10-23 LAB — URINALYSIS, ROUTINE W REFLEX MICROSCOPIC
BILIRUBIN URINE: NEGATIVE
Glucose, UA: NEGATIVE mg/dL
Hgb urine dipstick: NEGATIVE
KETONES UR: NEGATIVE mg/dL
Leukocytes, UA: NEGATIVE
NITRITE: NEGATIVE
Protein, ur: NEGATIVE mg/dL
SPECIFIC GRAVITY, URINE: 1.01 (ref 1.005–1.030)
UROBILINOGEN UA: 0.2 mg/dL (ref 0.0–1.0)
pH: 7.5 (ref 5.0–8.0)

## 2013-10-23 NOTE — MAU Provider Note (Signed)
Chief Complaint:  No chief complaint on file.   Christina Young is a 23 y.o.  G2P0010 with IUP at [redacted]w[redacted]d presenting for left sided lower abdominal pain (achy, 3/10, radiates to back) starting at 3 pm today. She denies any fevers, chills, dysuria or vaginal, burning,  discharge or pain.   Menstrual History: OB History   Grav Para Term Preterm Abortions TAB SAB Ect Mult Living   0     Patient's last menstrual period was 06/09/2013.      Past Medical History  Diagnosis Date  . Ulcerative colitis   . Physiological ovarian cysts   . ZOXWRUEA(540.9)     Past Surgical History  Procedure Laterality Date  . No past surgeries      Family History  Problem Relation Age of Onset  . Hypertension Father     History  Substance Use Topics  . Smoking status: Never Smoker   . Smokeless tobacco: Never Used  . Alcohol Use: No     No Known Allergies  Prescriptions prior to admission  Medication Sig Dispense Refill  . butalbital-acetaminophen-caffeine (FIORICET, ESGIC) 50-325-40 MG per tablet Take by mouth 2 (two) times daily as needed for headache.      . Calcium Carbonate-Simethicone (ROLAIDS MULTI-SYMPTOM PO) Take 1 tablet by mouth once.      . docusate sodium (COLACE) 100 MG capsule Take 1 capsule (100 mg total) by mouth 2 (two) times daily.  60 capsule  1  . Prenatal Vit-Fe Fumarate-FA (PRENATAL MULTIVITAMIN) TABS tablet Take 1 tablet by mouth daily at 12 noon.      . ondansetron (ZOFRAN ODT) 4 MG disintegrating tablet Take 1 tablet (4 mg total) by mouth every 8 (eight) hours as needed for nausea or vomiting.  20 tablet  0    Review of Systems - Negative except for what is mentioned in HPI.  Physical Exam  Blood pressure 103/53, pulse 93, temperature 98.2 F (36.8 C), temperature source Oral, resp. rate 20, height  (1.651 m), weight 63.163 kg (139 lb 4 oz), last menstrual period 06/09/2013. GENERAL: Well-developed, well-nourished female in no acute distress.   LUNGS: Clear to auscultation bilaterally.  HEART: Regular rate and rhythm. ABDOMEN: Soft, nontender, nondistended, gravid. No CVA tenderness  EXTREMITIES: Nontender, no edema, 2+ distal pulses. FHT:  145 via doppler Contractions: None   Labs: Results for orders placed during the hospital encounter of 10/23/13 (from the past 24 hour(s))  URINALYSIS, ROUTINE W REFLEX MICROSCOPIC   Collection Time    10/23/13  7:14 PM      Result Value Ref Range   Color, Urine YELLOW  YELLOW   APPearance CLEAR  CLEAR   Specific Gravity, Urine 1.010  1.005 - 1.030   pH 7.5  5.0 - 8.0   Glucose, UA NEGATIVE  NEGATIVE mg/dL   Hgb urine dipstick NEGATIVE  NEGATIVE   Bilirubin Urine NEGATIVE  NEGATIVE   Ketones, ur NEGATIVE  NEGATIVE mg/dL   Protein, ur NEGATIVE  NEGATIVE mg/dL   Urobilinogen, UA 0.2  0.0 - 1.0 mg/dL   Nitrite NEGATIVE  NEGATIVE   Leukocytes, UA NEGATIVE  NEGATIVE    Imaging Studies:  US Ob Comp + 14 Wk  10/18/2013   OBSTETRICAL ULTRASOUND: This exam was performed within a  Ultrasound Department. The OB US report was generated in the AS system, and faxed to the ordering physician.   This report is available in the [employer].  See the [facility] Obstetric US report via the Image Link.  Assessment: Christina Young is  23 y.o. G2P0010 at [redacted]w[redacted]d presents with Abdominal pain. Fetal heart rate wnl. No ctx, LOF or VB.   Plan: Round ligament pain - Reassured of the normal pains of pregnancy - Encouraged daily exercise as tolerated - Tylenol as needed   Wenda Low 8/25/20158:56 PM  OB fellow attestation:  I have seen and examined this patient; I agree with above documentation in the resident's note.   Christina Young is a 23 y.o. G2P0010 reporting lower abdominal pain, requesting excuse for work.  Currently asymptomatic +FM, denies LOF, VB, contractions, vaginal discharge.  PE: BP 119/69  Pulse 79  Temp(Src) 97.7 F (36.5 C) (Oral)  Resp 18  Ht  (1.651 m)  Wt 139  lb 4 oz (63.163 kg)  BMI 23.17 kg/m2  LMP 06/09/2013 Gen: calm comfortable, NAD Resp: normal effort, no distress Abd: gravid  ROS, labs, PMH reviewed  Plan: - continue routine follow up in OB clinic - discussed discomforts of pregnancy  Perry Mount, MD 9:18 PM

## 2013-10-23 NOTE — Discharge Instructions (Signed)
Second Trimester of Pregnancy The second trimester is from week 13 through week 28, months 4 through 6. The second trimester is often a time when you feel your best. Your body has also adjusted to being pregnant, and you begin to feel better physically. Usually, morning sickness has lessened or quit completely, you may have more energy, and you may have an increase in appetite. The second trimester is also a time when the fetus is growing rapidly. At the end of the sixth month, the fetus is about 9 inches long and weighs about 1 pounds. You will likely begin to feel the baby move (quickening) between 18 and 20 weeks of the pregnancy. BODY CHANGES Your body goes through many changes during pregnancy. The changes vary from woman to woman.   Your weight will continue to increase. You will notice your lower abdomen bulging out.  You may begin to get stretch marks on your hips, abdomen, and breasts.  You may develop headaches that can be relieved by medicines approved by your health care provider.  You may urinate more often because the fetus is pressing on your bladder.  You may develop or continue to have heartburn as a result of your pregnancy.  You may develop constipation because certain hormones are causing the muscles that push waste through your intestines to slow down.  You may develop hemorrhoids or swollen, bulging veins (varicose veins).  You may have back pain because of the weight gain and pregnancy hormones relaxing your joints between the bones in your pelvis and as a result of a shift in weight and the muscles that support your balance.  Your breasts will continue to grow and be tender.  Your gums may bleed and may be sensitive to brushing and flossing.  Dark spots or blotches (chloasma, mask of pregnancy) may develop on your face. This will likely fade after the baby is born.  A dark line from your belly button to the pubic area (linea nigra) may appear. This will likely fade  after the baby is born.  You may have changes in your hair. These can include thickening of your hair, rapid growth, and changes in texture. Some women also have hair loss during or after pregnancy, or hair that feels dry or thin. Your hair will most likely return to normal after your baby is born. WHAT TO EXPECT AT YOUR PRENATAL VISITS During a routine prenatal visit:  You will be weighed to make sure you and the fetus are growing normally.  Your blood pressure will be taken.  Your abdomen will be measured to track your baby's growth.  The fetal heartbeat will be listened to.  Any test results from the previous visit will be discussed. Your health care provider may ask you:  How you are feeling.  If you are feeling the baby move.  If you have had any abnormal symptoms, such as leaking fluid, bleeding, severe headaches, or abdominal cramping.  If you have any questions. Other tests that may be performed during your second trimester include:  Blood tests that check for:  Low iron levels (anemia).  Gestational diabetes (between 24 and 28 weeks).  Rh antibodies.  Urine tests to check for infections, diabetes, or protein in the urine.  An ultrasound to confirm the proper growth and development of the baby.  An amniocentesis to check for possible genetic problems.  Fetal screens for spina bifida and Down syndrome. HOME CARE INSTRUCTIONS   Avoid all smoking, herbs, alcohol, and unprescribed   drugs. These chemicals affect the formation and growth of the baby.  Follow your health care provider's instructions regarding medicine use. There are medicines that are either safe or unsafe to take during pregnancy.  Exercise only as directed by your health care provider. Experiencing uterine cramps is a good sign to stop exercising.  Continue to eat regular, healthy meals.  Wear a good support bra for breast tenderness.  Do not use hot tubs, steam rooms, or saunas.  Wear your  seat belt at all times when driving.  Avoid raw meat, uncooked cheese, cat litter boxes, and soil used by cats. These carry germs that can cause birth defects in the baby.  Take your prenatal vitamins.  Try taking a stool softener (if your health care provider approves) if you develop constipation. Eat more high-fiber foods, such as fresh vegetables or fruit and whole grains. Drink plenty of fluids to keep your urine clear or pale yellow.  Take warm sitz baths to soothe any pain or discomfort caused by hemorrhoids. Use hemorrhoid cream if your health care provider approves.  If you develop varicose veins, wear support hose. Elevate your feet for 15 minutes, 3-4 times a day. Limit salt in your diet.  Avoid heavy lifting, wear low heel shoes, and practice good posture.  Rest with your legs elevated if you have leg cramps or low back pain.  Visit your dentist if you have not gone yet during your pregnancy. Use a soft toothbrush to brush your teeth and be gentle when you floss.  A sexual relationship may be continued unless your health care provider directs you otherwise.  Continue to go to all your prenatal visits as directed by your health care provider. SEEK MEDICAL CARE IF:   You have dizziness.  You have mild pelvic cramps, pelvic pressure, or nagging pain in the abdominal area.  You have persistent nausea, vomiting, or diarrhea.  You have a bad smelling vaginal discharge.  You have pain with urination. SEEK IMMEDIATE MEDICAL CARE IF:   You have a fever.  You are leaking fluid from your vagina.  You have spotting or bleeding from your vagina.  You have severe abdominal cramping or pain.  You have rapid weight gain or loss.  You have shortness of breath with chest pain.  You notice sudden or extreme swelling of your face, hands, ankles, feet, or legs.  You have not felt your baby move in over an hour.  You have severe headaches that do not go away with  medicine.  You have vision changes. Document Released: 02/09/2001 Document Revised: 02/20/2013 Document Reviewed: 04/18/2012 [employer] Patient Information 2015 [employer]. This information is not intended to replace advice given to you by your health care provider. Make sure you discuss any questions you have with your health care provider.  

## 2013-10-23 NOTE — MAU Note (Signed)
PT  SAYS  SHE CAME AND HAD U/S ON 8-20- HERE-  FEELS LIKE BABY HAS MOVED - AND HURTS .  LOWER BACK STARTED HURTING AT 3PM-   PAIN IS LESS NOW.    NO MEDS     LAST SEX-   8-13.  PLANS TO GET [facility]  WITH [facility].-  WAS GOING  TO [facility]- LAST SEEN ON 7-12.   [facility] - 1 ST APPOINTMENT IS  8-31.

## 2013-10-25 NOTE — MAU Provider Note (Signed)
Attestation of Attending Supervision of Obstetric Fellow: Evaluation and management procedures were performed by the Obstetric Fellow under my supervision and collaboration.  I have reviewed the Obstetric Fellow's note and chart, and I agree with the management and plan.  Jaydi Bray, MD, FACOG Attending Obstetrician & Gynecologist Faculty Practice, [facility]   

## 2013-11-01 ENCOUNTER — Encounter (HOSPITAL_COMMUNITY): Payer: Self-pay | Admitting: *Deleted

## 2013-11-01 ENCOUNTER — Inpatient Hospital Stay (HOSPITAL_COMMUNITY)
Admission: AD | Admit: 2013-11-01 | Discharge: 2013-11-01 | Disposition: A | Discharge status: Home / Self Care | Payer: BC Managed Care – PPO | Source: Ambulatory Visit | Attending: Obstetrics & Gynecology | Admitting: Obstetrics & Gynecology

## 2013-11-01 DIAGNOSIS — R21 Rash and other nonspecific skin eruption: Secondary | ICD-10-CM | POA: Insufficient documentation

## 2013-11-01 DIAGNOSIS — O9989 Other specified diseases and conditions complicating pregnancy, childbirth and the puerperium: Principal | ICD-10-CM

## 2013-11-01 DIAGNOSIS — J3489 Other specified disorders of nose and nasal sinuses: Secondary | ICD-10-CM | POA: Diagnosis not present

## 2013-11-01 DIAGNOSIS — O99891 Other specified diseases and conditions complicating pregnancy: Secondary | ICD-10-CM

## 2013-11-01 NOTE — MAU Provider Note (Signed)
None     Chief Complaint:  Rash and Nasal Congestion   Christina Young is  23 y.o. G2P0010 at [redacted]w[redacted]d presents complaining of Rash and Nasal Congestion .  PT SAYS LAST WED- SHE SAW A MICE IN HER APARTMENT AND NOW SHE HAS A RASH ON HER ARMS AND LEGS. FEELS CONGESTED IN NOSE AND ROOMMATE HAS RASH. SHE ALSO SAW SOMETHING BLACK ON HER SHEET THAT GOT ON HER EAR. THE EXTERMINATORS CAME AND PUT OUT MOUSE TRAPS AND SPRAYED .  Wants a note stating that it is not safe to live around mice while pregnant  Obstetrical/Gynecological History: OB History   Grav Para Term Preterm Abortions TAB SAB Ect Mult Living   0     Past Medical History: Past Medical History  Diagnosis Date  . Ulcerative colitis   . Physiological ovarian cysts   . ZOXWRUEA(540.9)     Past Surgical History: Past Surgical History  Procedure Laterality Date  . No past surgeries      Family History: Family History  Problem Relation Age of Onset  . Hypertension Father     Social History: History  Substance Use Topics  . Smoking status: Never Smoker   . Smokeless tobacco: Never Used  . Alcohol Use: No    Allergies: No Known Allergies  Meds:  No prescriptions prior to admission    Review of Systems   Constitutional: Negative for fever and chills Eyes: Negative for visual disturbances Respiratory: Negative for shortness of breath, dyspnea Cardiovascular: Negative for chest pain or palpitations  Gastrointestinal: Negative for vomiting, diarrhea and constipation Genitourinary: Negative for dysuria and urgency Musculoskeletal: Negative for back pain, joint pain, myalgias  Neurological: Negative for dizziness and headaches     Physical Exam  Blood pressure 103/50, pulse 76, temperature 98.2 F (36.8 C), temperature source Oral, resp. rate 18, height  (1.651 m), weight 62.596 kg (138 lb), last menstrual period 06/09/2013, SpO2 99.00%.    2 very small, almost nonexistant, red areas on left  arm, examined by Dr. Marice Potter   Assessment: Christina Young is  23 y.o. G2P0010 at [redacted]w[redacted]d presents with unspecified rash.  Plan: May use cortisone  CRESENZO-DISHMAN,Teancum Brule 9/3/201510:01 PM

## 2013-11-01 NOTE — MAU Note (Signed)
PT SAYS LAST WED- SHE SAW A MICE IN HER APARTMENT AND NOW SHE HAS A RASH  ON HER ARMS AND LEGS.    FEELS CONGESTED  IN NOSE   AND ROOMMATE HAS RASH.  SHE ALSO SAW SOMETHING BLACK ON HER SHEET   THAT GOT ON HER EAR.    THE EXTERMINATORS  CAME AND PUT OUT MOUSE TRAPS AND SPRAYED .

## 2013-11-01 NOTE — MAU Note (Signed)
Pt reports nasal congestion. Denies fever. Pt reports rash on arms and thighs off/on x one week. Using cortisone cream when needed.

## 2013-11-19 ENCOUNTER — Other Ambulatory Visit: Payer: Self-pay

## 2013-11-19 ENCOUNTER — Encounter: Payer: Self-pay | Admitting: Obstetrics

## 2013-11-19 LAB — OB RESULTS CONSOLE RUBELLA ANTIBODY, IGM: RUBELLA: IMMUNE

## 2013-11-19 LAB — OB RESULTS CONSOLE RPR: RPR: NONREACTIVE

## 2013-11-19 LAB — OB RESULTS CONSOLE GC/CHLAMYDIA
Chlamydia: NEGATIVE
Gonorrhea: NEGATIVE

## 2013-11-19 LAB — OB RESULTS CONSOLE ABO/RH: RH Type: POSITIVE

## 2013-11-19 LAB — OB RESULTS CONSOLE ANTIBODY SCREEN: Antibody Screen: NEGATIVE

## 2013-11-19 LAB — OB RESULTS CONSOLE HIV ANTIBODY (ROUTINE TESTING): HIV: NONREACTIVE

## 2013-11-19 LAB — OB RESULTS CONSOLE HEPATITIS B SURFACE ANTIGEN: HEP B S AG: NEGATIVE

## 2013-11-20 LAB — CYTOLOGY - PAP

## 2013-11-22 ENCOUNTER — Ambulatory Visit (HOSPITAL_COMMUNITY)
Admission: RE | Admit: 2013-11-22 | Discharge: 2013-11-22 | Disposition: A | Discharge status: Home / Self Care | Payer: BC Managed Care – PPO | Source: Ambulatory Visit | Attending: Nurse Practitioner | Admitting: Nurse Practitioner

## 2013-11-22 DIAGNOSIS — Z1389 Encounter for screening for other disorder: Secondary | ICD-10-CM | POA: Diagnosis not present

## 2013-11-22 DIAGNOSIS — IMO0002 Reserved for concepts with insufficient information to code with codable children: Secondary | ICD-10-CM

## 2013-11-22 DIAGNOSIS — Z363 Encounter for antenatal screening for malformations: Secondary | ICD-10-CM | POA: Insufficient documentation

## 2013-11-22 DIAGNOSIS — Z0489 Encounter for examination and observation for other specified reasons: Secondary | ICD-10-CM

## 2013-11-27 ENCOUNTER — Inpatient Hospital Stay (HOSPITAL_COMMUNITY)
Admission: AD | Admit: 2013-11-27 | Discharge: 2013-11-27 | Disposition: A | Discharge status: Home / Self Care | Payer: BC Managed Care – PPO | Source: Ambulatory Visit | Attending: Obstetrics and Gynecology | Admitting: Obstetrics and Gynecology

## 2013-11-27 ENCOUNTER — Encounter (HOSPITAL_COMMUNITY): Payer: Self-pay | Admitting: *Deleted

## 2013-11-27 DIAGNOSIS — O99891 Other specified diseases and conditions complicating pregnancy: Secondary | ICD-10-CM

## 2013-11-27 DIAGNOSIS — O212 Late vomiting of pregnancy: Secondary | ICD-10-CM | POA: Diagnosis present

## 2013-11-27 DIAGNOSIS — A088 Other specified intestinal infections: Secondary | ICD-10-CM

## 2013-11-27 DIAGNOSIS — O9989 Other specified diseases and conditions complicating pregnancy, childbirth and the puerperium: Principal | ICD-10-CM

## 2013-11-27 DIAGNOSIS — A084 Viral intestinal infection, unspecified: Secondary | ICD-10-CM

## 2013-11-27 LAB — COMPREHENSIVE METABOLIC PANEL
ALT: 29 U/L (ref 0–35)
AST: 30 U/L (ref 0–37)
Albumin: 2.7 g/dL — ABNORMAL LOW (ref 3.5–5.2)
Alkaline Phosphatase: 86 U/L (ref 39–117)
Anion gap: 12 (ref 5–15)
BUN: 8 mg/dL (ref 6–23)
CALCIUM: 8.6 mg/dL (ref 8.4–10.5)
CO2: 22 meq/L (ref 19–32)
CREATININE: 0.71 mg/dL (ref 0.50–1.10)
Chloride: 103 mEq/L (ref 96–112)
GFR calc Af Amer: >90 mL/min (ref >90)
GLUCOSE: 83 mg/dL (ref 70–99)
Potassium: 4 mEq/L (ref 3.7–5.3)
SODIUM: 137 meq/L (ref 137–147)
TOTAL PROTEIN: 6.3 g/dL (ref 6.0–8.3)
Total Bilirubin: <0.2 mg/dL — ABNORMAL LOW (ref 0.3–1.2)

## 2013-11-27 LAB — CBC
HCT: 31.8 % — ABNORMAL LOW (ref 36.0–46.0)
Hemoglobin: 10.6 g/dL — ABNORMAL LOW (ref 12.0–15.0)
MCH: 28.6 pg (ref 26.0–34.0)
MCHC: 33.3 g/dL (ref 30.0–36.0)
MCV: 85.9 fL (ref 78.0–100.0)
Platelets: 173 10*3/uL (ref 150–400)
RBC: 3.7 MIL/uL — ABNORMAL LOW (ref 3.87–5.11)
RDW: 13.7 % (ref 11.5–15.5)
WBC: 12.8 10*3/uL — ABNORMAL HIGH (ref 4.0–10.5)

## 2013-11-27 LAB — URINALYSIS, ROUTINE W REFLEX MICROSCOPIC
Bilirubin Urine: NEGATIVE
Glucose, UA: NEGATIVE mg/dL
HGB URINE DIPSTICK: NEGATIVE
Ketones, ur: NEGATIVE mg/dL
Nitrite: NEGATIVE
PROTEIN: NEGATIVE mg/dL
SPECIFIC GRAVITY, URINE: 1.01 (ref 1.005–1.030)
UROBILINOGEN UA: 0.2 mg/dL (ref 0.0–1.0)
pH: 6 (ref 5.0–8.0)

## 2013-11-27 LAB — URINE MICROSCOPIC-ADD ON

## 2013-11-27 MED ORDER — IBUPROFEN 600 MG PO TABS
600.0000 mg | ORAL_TABLET | Freq: Once | ORAL | Status: AC
  Administered 2013-11-27: 600 mg via ORAL
  Filled 2013-11-27: qty 1

## 2013-11-27 MED ORDER — ONDANSETRON 8 MG PO TBDP: 8.0000 mg | ORAL_TABLET | Freq: Three times a day (TID) | ORAL | Status: DC | PRN

## 2013-11-27 MED ORDER — ONDANSETRON 8 MG PO TBDP
8.0000 mg | ORAL_TABLET | Freq: Once | ORAL | Status: AC
  Administered 2013-11-27: 8 mg via ORAL
  Filled 2013-11-27: qty 1

## 2013-11-27 NOTE — Progress Notes (Signed)
Pt states does feel some better. Up to BR and u/a sent.

## 2013-11-27 NOTE — MAU Provider Note (Signed)
History     CSN: [id number]  Arrival date and time: 11/27/13 1527   First Provider Initiated Contact with Patient 11/27/13 1657      Chief Complaint  Patient presents with  . Nausea  . Facial Pain  . Headache   HPI  Christina Young is a 23 y.o.  G2P0010 at 687w2d who presents today with nausea and vomiting, and a headache. She states that she started throwing up yesterday. She denies any close contacts who have been sick. She states that she had a headache when she woke up at 0400. She has not taking anything for the headache prior to coming here. She has also had congestion, and states that a lot of people at work have had coughs and colds.   Past Medical History  Diagnosis Date  . Ulcerative colitis   . Physiological ovarian cysts   . NGEXBMWU(132.4Headache(784.0)     Past Surgical History  Procedure Laterality Date  . No past surgeries      Family History  Problem Relation Age of Onset  . Hypertension Father     History  Substance Use Topics  . Smoking status: Never Smoker   . Smokeless tobacco: Never Used  . Alcohol Use: No    Allergies: No Known Allergies  Prescriptions prior to admission  Medication Sig Dispense Refill  . Calcium Carbonate-Simethicone (ROLAIDS MULTI-SYMPTOM PO) Take 1 tablet by mouth as needed (heartburn).       . docusate sodium (COLACE) 100 MG capsule Take 1 capsule (100 mg total) by mouth 2 (two) times daily.  60 capsule  1  . Prenatal Vit-Fe Fumarate-FA (PRENATAL MULTIVITAMIN) TABS tablet Take 1 tablet by mouth daily at 12 noon.        ROS Physical Exam   Blood pressure 112/56, pulse 97, temperature 98.4 F (36.9 C), temperature source Oral, resp. rate 18, height 5\' 5"  (1.651 m), weight 67.132 kg (148 lb), last menstrual period 06/09/2013.  Physical Exam  Nursing note and vitals reviewed. Constitutional: She is oriented to person, place, and time. She appears well-developed and well-nourished. No distress.  Cardiovascular: Normal rate.    Respiratory: Effort normal.  GI: Soft. There is no tenderness. There is no rebound.  Neurological: She is alert and oriented to person, place, and time.  Skin: Skin is warm and dry.  Psychiatric: She has a normal mood and affect.    MAU Course  Procedures  Results for orders placed during the hospital encounter of 11/27/13 (from the past 24 hour(s))  CBC     Status: Abnormal   Collection Time    11/27/13  4:40 PM      Result Value Ref Range   WBC 12.8 (*) 4.0 - 10.5 K/uL   RBC 3.70 (*) 3.87 - 5.11 MIL/uL   Hemoglobin 10.6 (*) 12.0 - 15.0 g/dL   HCT 40.131.8 (*) 02.736.0 - 25.346.0 %   MCV 85.9  78.0 - 100.0 fL   MCH 28.6  26.0 - 34.0 pg   MCHC 33.3  30.0 - 36.0 g/dL   RDW 66.413.7  40.311.5 - 47.415.5 %   Platelets 173  150 - 400 K/uL  COMPREHENSIVE METABOLIC PANEL     Status: Abnormal   Collection Time    11/27/13  4:40 PM      Result Value Ref Range   Sodium 137  137 - 147 mEq/L   Potassium 4.0  3.7 - 5.3 mEq/L   Chloride 103  96 - 112 mEq/L   CO2  22  19 - 32 mEq/L   Glucose, Bld 83  70 - 99 mg/dL   BUN 8  6 - 23 mg/dL   Creatinine, Ser 1.61  0.50 - 1.10 mg/dL   Calcium 8.6  8.4 - 09.6 mg/dL   Total Protein 6.3  6.0 - 8.3 g/dL   Albumin 2.7 (*) 3.5 - 5.2 g/dL   AST 30  0 - 37 U/L   ALT 29  0 - 35 U/L   Alkaline Phosphatase 86  39 - 117 U/L   Total Bilirubin <0.2 (*) 0.3 - 1.2 mg/dL   GFR calc non Af Amer >90  >90 mL/min   GFR calc Af Amer >90  >90 mL/min   Anion gap 12  5 - 15     1717: D/W Dr. Henderson Cloud, will treat headache and nausea.  1835: Patient is feeling better at this time. She has not vomited while being here, and it tolerating and PO here. Her headache is resolved.  Assessment and Plan   1. Viral gastroenteritis    Comfort measures reviewed Return to MAU as needed  Follow-up Information   Follow up with Dametria Tuzzolino A, MD. (As scheduled)    Specialty:  Obstetrics and Gynecology   Contact information:   [address] [place] [address] [place]  [address] [phone]        Tawnya Crook 11/27/2013, 5:00 PM

## 2013-11-27 NOTE — MAU Note (Signed)
Pt unable to void since arriving to MAU

## 2013-11-27 NOTE — Discharge Instructions (Signed)
When you have vomiting or diarrhea, the foods you eat and your eating habits are very important. Choosing the right foods and drinks can help relieve diarrhea. Also, because diarrhea can last up to 7 days, you need to replace lost fluids and electrolytes (such as sodium, potassium, and chloride) in order to help prevent dehydration.  WHAT GENERAL GUIDELINES DO I NEED TO FOLLOW?  Slowly drink 1 cup (8 oz) of fluid for each episode of vomiting/diarrhea. If you are getting enough fluid, your urine will be clear or pale yellow.  Eat starchy foods. Some good choices include white rice, white toast, pasta, low-fiber cereal, baked potatoes (without the skin), saltine crackers, and bagels.  Avoid large servings of any cooked vegetables.  Limit fruit to two servings per day. A serving is  cup or 1 small piece.  Choose foods with less than 2 g of fiber per serving.  Limit fats to less than 8 tsp (38 g) per day.  Avoid fried foods.  Eat foods that have probiotics in them. Probiotics can be found in certain dairy products.  Avoid foods and beverages that may increase the speed at which food moves through the stomach and intestines (gastrointestinal tract). Things to avoid include:  High-fiber foods, such as dried fruit, raw fruits and vegetables, nuts, seeds, and whole grain foods.  Spicy foods and high-fat foods.  Foods and beverages sweetened with high-fructose corn syrup, honey, or sugar alcohols such as xylitol, sorbitol, and mannitol. WHAT FOODS ARE RECOMMENDED? Grains White rice. White, JamaicaFrench, or pita breads (fresh or toasted), including plain rolls, buns, or bagels. White pasta. Saltine, soda, or graham crackers. Pretzels. Low-fiber cereal. Cooked cereals made with water (such as cornmeal, farina, or cream cereals). Plain muffins. Matzo. Melba toast. Zwieback.  Vegetables Potatoes (without the skin). Strained tomato and vegetable juices. Most well-cooked and canned vegetables without  seeds. Tender lettuce. Fruits Cooked or canned applesauce, apricots, cherries, fruit cocktail, grapefruit, peaches, pears, or plums. Fresh bananas, apples without skin, cherries, grapes, cantaloupe, grapefruit, peaches, oranges, or plums.  Meat and Other Protein Products Baked or boiled chicken. Eggs. Tofu. Fish. Seafood. Smooth peanut butter. Ground or well-cooked tender beef, ham, veal, lamb, pork, or poultry.  Dairy Plain yogurt, kefir, and unsweetened liquid yogurt. Lactose-free milk, buttermilk, or soy milk. Plain hard cheese. Beverages Sport drinks. Clear broths. Diluted fruit juices (except prune). Regular, caffeine-free sodas such as ginger ale. Water. Decaffeinated teas. Oral rehydration solutions. Sugar-free beverages not sweetened with sugar alcohols. Other Bouillon, broth, or soups made from recommended foods.  The items listed above may not be a complete list of recommended foods or beverages. Contact your dietitian for more options. WHAT FOODS ARE NOT RECOMMENDED? Grains Whole grain, whole wheat, bran, or rye breads, rolls, pastas, crackers, and cereals. Wild or brown rice. Cereals that contain more than 2 g of fiber per serving. Corn tortillas or taco shells. Cooked or dry oatmeal. Granola. Popcorn. Vegetables Raw vegetables. Cabbage, broccoli, Brussels sprouts, artichokes, baked beans, beet greens, corn, kale, legumes, peas, sweet potatoes, and yams. Potato skins. Cooked spinach and cabbage. Fruits Dried fruit, including raisins and dates. Raw fruits. Stewed or dried prunes. Fresh apples with skin, apricots, mangoes, pears, raspberries, and strawberries.  Meat and Other Protein Products Chunky peanut butter. Nuts and seeds. Beans and lentils. Tomasa BlaseBacon.  Dairy High-fat cheeses. Milk, chocolate milk, and beverages made with milk, such as milk shakes. Cream. Ice cream. Sweets and Desserts Sweet rolls, doughnuts, and sweet breads. Pancakes and waffles. Fats and  Oils Butter. Cream  sauces. Margarine. Salad oils. Plain salad dressings. Olives. Avocados.  Beverages Caffeinated beverages (such as coffee, tea, soda, or energy drinks). Alcoholic beverages. Fruit juices with pulp. Prune juice. Soft drinks sweetened with high-fructose corn syrup or sugar alcohols. Other Coconut. Hot sauce. Chili powder. Mayonnaise. Gravy. Cream-based or milk-based soups.  The items listed above may not be a complete list of foods and beverages to avoid. Contact your dietitian for more information. WHAT SHOULD I DO IF I BECOME DEHYDRATED? Diarrhea can sometimes lead to dehydration. Signs of dehydration include dark urine and dry mouth and skin. If you think you are dehydrated, you should rehydrate with an oral rehydration solution. These solutions can be purchased at pharmacies, retail stores, or online.  Drink -1 cup (120-240 mL) of oral rehydration solution each time you have an episode of diarrhea. If drinking this amount makes your diarrhea worse, try drinking smaller amounts more often. For example, drink 1-3 tsp (5-15 mL) every 5-10 minutes.  A general rule for staying hydrated is to drink 1-2 L of fluid per day. Talk to your health care provider about the specific amount you should be drinking each day. Drink enough fluids to keep your urine clear or pale yellow. Document Released: 05/08/2003 Document Revised: 02/20/2013 Document Reviewed: 01/08/2013 [facility] Patient Information 2015 [place], [place]. This information is not intended to replace advice given to you by your health care provider. Make sure you discuss any questions you have with your health care provider.

## 2013-11-27 NOTE — MAU Note (Signed)
Nausea yesterday. Woke at 0400, pressure in head, felt like it was going to explode.  Pressure on forehead and below eyes.  'face hurts'. Feels stuffed up.  Called office, told to come here.

## 2013-11-27 NOTE — MAU Note (Signed)
Pt. States she is feeling better and rates her pain a 7/10. Denies nausea and vomiting currently.

## 2013-12-31 ENCOUNTER — Encounter (HOSPITAL_COMMUNITY): Payer: Self-pay | Admitting: *Deleted

## 2014-01-09 ENCOUNTER — Encounter: Payer: Medicaid Other | Attending: Obstetrics and Gynecology

## 2014-01-09 VITALS — Ht 66.0 in | Wt 156.0 lb

## 2014-01-09 DIAGNOSIS — Z713 Dietary counseling and surveillance: Secondary | ICD-10-CM | POA: Diagnosis not present

## 2014-01-09 DIAGNOSIS — O24419 Gestational diabetes mellitus in pregnancy, unspecified control: Secondary | ICD-10-CM | POA: Diagnosis present

## 2014-01-10 NOTE — Progress Notes (Signed)
  Patient was seen on 01/09/14 for Gestational Diabetes self-management . The following learning objectives were met by the patient :   States the definition of Gestational Diabetes  States why dietary management is important in controlling blood glucose  Describes the effects of carbohydrates on blood glucose levels  Demonstrates ability to create a balanced meal plan  Demonstrates carbohydrate counting   States when to check blood glucose levels  Demonstrates proper blood glucose monitoring techniques  States the effect of stress and exercise on blood glucose levels  States the importance of limiting caffeine and abstaining from alcohol and smoking  Plan:  Aim for 2 Carb Choices per meal (30 grams) +/- 1 either way for breakfast Aim for 3 Carb Choices per meal (45 grams) +/- 1 either way from lunch and dinner Aim for 1-2 Carbs per snack Begin reading food labels for Total Carbohydrate and sugar grams of foods Consider  increasing your activity level by walking daily as tolerated Begin checking BG before breakfast and 2 hours after first bit of breakfast, lunch and dinner after  as directed by MD  Take medication  as directed by MD  Blood glucose monitor given: Accu Chek Nano BG Monitoring Kit Lot # [id number] Exp: 10/30/14 Blood glucose reading: 73m/dl  Patient instructed to monitor glucose levels: FBS: 60 - <90 2 hour: <120  Patient received the following handouts:  Nutrition Diabetes and Pregnancy  Carbohydrate Counting List  Meal Planning worksheet  Patient will be seen for follow-up as needed.

## 2014-02-05 ENCOUNTER — Other Ambulatory Visit: Payer: Self-pay | Admitting: Obstetrics & Gynecology

## 2014-02-05 LAB — OB RESULTS CONSOLE GBS: GBS: NEGATIVE

## 2014-02-20 ENCOUNTER — Encounter (HOSPITAL_COMMUNITY): Payer: Self-pay | Admitting: *Deleted

## 2014-02-20 ENCOUNTER — Telehealth (HOSPITAL_COMMUNITY): Payer: Self-pay | Admitting: *Deleted

## 2014-02-20 NOTE — Telephone Encounter (Signed)
Preadmission screen  

## 2014-02-24 ENCOUNTER — Encounter (HOSPITAL_COMMUNITY): Payer: Self-pay

## 2014-02-24 ENCOUNTER — Inpatient Hospital Stay (HOSPITAL_COMMUNITY)
Admission: RE | Admit: 2014-02-24 | Discharge: 2014-02-27 | DRG: 775 | Disposition: A | Discharge status: Home / Self Care | Payer: Medicaid Other | Source: Ambulatory Visit | Attending: Obstetrics | Admitting: Obstetrics

## 2014-02-24 ENCOUNTER — Inpatient Hospital Stay (HOSPITAL_COMMUNITY): Admission: RE | Admit: 2014-02-24 | Payer: Medicaid Other | Source: Ambulatory Visit | Admitting: Obstetrics

## 2014-02-24 DIAGNOSIS — O365990 Maternal care for other known or suspected poor fetal growth, unspecified trimester, not applicable or unspecified: Secondary | ICD-10-CM | POA: Diagnosis present

## 2014-02-24 DIAGNOSIS — O24420 Gestational diabetes mellitus in childbirth, diet controlled: Secondary | ICD-10-CM | POA: Diagnosis present

## 2014-02-24 DIAGNOSIS — Z3A38 38 weeks gestation of pregnancy: Secondary | ICD-10-CM | POA: Diagnosis present

## 2014-02-24 DIAGNOSIS — Z3483 Encounter for supervision of other normal pregnancy, third trimester: Secondary | ICD-10-CM | POA: Diagnosis present

## 2014-02-24 LAB — ABO/RH: ABO/RH(D): O POS

## 2014-02-24 LAB — CBC
HCT: 37 % (ref 36.0–46.0)
HEMOGLOBIN: 12.4 g/dL (ref 12.0–15.0)
MCH: 29.3 pg (ref 26.0–34.0)
MCHC: 33.5 g/dL (ref 30.0–36.0)
MCV: 87.5 fL (ref 78.0–100.0)
Platelets: 142 10*3/uL — ABNORMAL LOW (ref 150–400)
RBC: 4.23 MIL/uL (ref 3.87–5.11)
RDW: 14 % (ref 11.5–15.5)
WBC: 9.3 10*3/uL (ref 4.0–10.5)

## 2014-02-24 LAB — TYPE AND SCREEN
ABO/RH(D): O POS
Antibody Screen: NEGATIVE

## 2014-02-24 MED ORDER — OXYTOCIN 40 UNITS IN LACTATED RINGERS INFUSION - SIMPLE MED
62.5000 mL/h | INTRAVENOUS | Status: DC
  Filled 2014-02-24: qty 1000

## 2014-02-24 MED ORDER — ONDANSETRON HCL 4 MG/2ML IJ SOLN: 4.0000 mg | Freq: Four times a day (QID) | INTRAMUSCULAR | Status: DC | PRN

## 2014-02-24 MED ORDER — OXYTOCIN BOLUS FROM INFUSION: 500.0000 mL | INTRAVENOUS | Status: DC

## 2014-02-24 MED ORDER — MISOPROSTOL 25 MCG QUARTER TABLET
25.0000 ug | ORAL_TABLET | ORAL | Status: DC | PRN
  Administered 2014-02-24: 25 ug via VAGINAL
  Filled 2014-02-24: qty 1
  Filled 2014-02-24: qty 0.25

## 2014-02-24 MED ORDER — OXYCODONE-ACETAMINOPHEN 5-325 MG PO TABS: 2.0000 | ORAL_TABLET | ORAL | Status: DC | PRN

## 2014-02-24 MED ORDER — DIPHENHYDRAMINE HCL 25 MG PO CAPS
25.0000 mg | ORAL_CAPSULE | Freq: Every evening | ORAL | Status: DC | PRN
Start: 2014-02-24 — End: 2014-02-25

## 2014-02-24 MED ORDER — TERBUTALINE SULFATE 1 MG/ML IJ SOLN: 0.2500 mg | Freq: Once | INTRAMUSCULAR | Status: AC | PRN

## 2014-02-24 MED ORDER — LIDOCAINE HCL (PF) 1 % IJ SOLN
30.0000 mL | INTRAMUSCULAR | Status: AC | PRN
  Administered 2014-02-25: 30 mL via SUBCUTANEOUS
  Filled 2014-02-24: qty 30

## 2014-02-24 MED ORDER — CITRIC ACID-SODIUM CITRATE 334-500 MG/5ML PO SOLN: 30.0000 mL | ORAL | Status: DC | PRN

## 2014-02-24 MED ORDER — OXYCODONE-ACETAMINOPHEN 5-325 MG PO TABS
1.0000 | ORAL_TABLET | ORAL | Status: DC | PRN
Start: 2014-02-24 — End: 2014-02-25

## 2014-02-24 MED ORDER — LACTATED RINGERS IV SOLN
INTRAVENOUS | Status: DC
  Administered 2014-02-24 – 2014-02-25 (×2): via INTRAVENOUS

## 2014-02-24 MED ORDER — BUTORPHANOL TARTRATE 1 MG/ML IJ SOLN
1.0000 mg | INTRAMUSCULAR | Status: DC | PRN
  Administered 2014-02-25 (×2): 1 mg via INTRAVENOUS
  Filled 2014-02-24 (×2): qty 1

## 2014-02-24 MED ORDER — LACTATED RINGERS IV SOLN: 500.0000 mL | INTRAVENOUS | Status: DC | PRN

## 2014-02-24 MED ORDER — ACETAMINOPHEN 325 MG PO TABS: 650.0000 mg | ORAL_TABLET | ORAL | Status: DC | PRN

## 2014-02-24 NOTE — H&P (Signed)
23 y.o. G2P0010 @ 2643w1d w EDD 03/09/14 who presents for IOL 2/2 IUGR.  Otherwise has good fetal movement and no bleeding.  Pregnancy c/b: 1. IUGR:  Diagnosed at 32 weeks w EFW<10%.  Last EFW 12/23 2432g (5lb 6oz), overall 10% with AC 3%, AFI 11, normal umbilical artery dopplers, vertex.  Per MFM recs, delivery indicated at 38 weeks.   2. Gestational diabetes: diet controlled 3. Late prenatal care: dated by a 19 week US.   4. Ulcerative colitis: on no medications.  No flares during pregnancy  Past Medical History  Diagnosis Date  . Ulcerative colitis   . Gestational diabetes mellitus, antepartum     Past Surgical History  Procedure Laterality Date  . No past surgeries      OB History  Gravida Para Term Preterm AB SAB TAB Ectopic Multiple Living  2    1 1     0    # Outcome Date GA Lbr Len/2nd Weight Sex Delivery Anes PTL Lv  2 Current           1 SAB               History   Social History  . Marital Status: Single    Spouse Name: N/A    Number of Children: 0  . Years of Education: N/A   Occupational History  .      [employer]; customer care   Social History Main Topics  . Smoking status: Never Smoker   . Smokeless tobacco: Never Used  . Alcohol Use: No  . Drug Use: No  . Sexual Activity: Yes    Birth Control/ Protection: None     Comment: pregnant   Other Topics Concern  . 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       Tobacco: none      Alcohol: none      Drugs: none      Sexual activity: condoms; OCP; +trich hx   Review of patient's allergies indicates no known allergies.    Prenatal Transfer Tool  Maternal Diabetes: Yes:  Diabetes Type:  Diet controlled Genetic Screening: Normal Maternal Ultrasounds/Referrals: Abnormal:  Findings:   IUGR Fetal Ultrasounds or other Referrals:  None Maternal Substance Abuse:  No Significant Maternal Medications:   None Significant Maternal Lab Results: Lab values include: Group B Strep negative  ABO, Rh: O/Positive/-- (09/21 0000) Antibody: Negative (09/21 0000) Rubella:  Immune RPR: Nonreactive (09/21 0000)  HBsAg: Negative (09/21 0000)  HIV: Non-reactive (09/21 0000)  GBS: Negative (12/08 0000)    Filed Vitals:   02/24/14 2104  BP: 125/67  Pulse: 73  Resp: 18     General:  NAD Abdomen:  soft, gravid, EFW 5.5# SVE:  1/50/-2 per RN FHTs:  140s, mod var, cat 1 Toco:  occ contraction   A/P   [age] y.o. 9047w0d  G2P0010 presents for IOL 2/2 IUGR  IUGR by AC <5%.  Most recent US EFW at 10%, limited by late gestational age.  UA dopplers have remained normal.  Delivery at 38 wks recommended per MFM GDMA1: Will plan q4h FSBS during cervical ripening, change to q1-2hr when pitocin started with plan for goal BS 70-110.  Insulin gtt if needed IOL: cervix unfavorable.  Cervical ripening with cytotec.  Epidural upon maternal request FSR/ vtx/ GBS neg  [place] AFBLARK, [facility] Surgical HospitalDYANNA

## 2014-02-25 ENCOUNTER — Encounter (HOSPITAL_COMMUNITY): Payer: Self-pay

## 2014-02-25 LAB — GLUCOSE, CAPILLARY
GLUCOSE-CAPILLARY: 80 mg/dL (ref 70–99)
Glucose-Capillary: 73 mg/dL (ref 70–99)
Glucose-Capillary: 91 mg/dL (ref 70–99)

## 2014-02-25 LAB — HIV ANTIBODY (ROUTINE TESTING W REFLEX): HIV 1&2 Ab, 4th Generation: NONREACTIVE

## 2014-02-25 LAB — RPR

## 2014-02-25 MED ORDER — DIPHENHYDRAMINE HCL 25 MG PO CAPS: 25.0000 mg | ORAL_CAPSULE | Freq: Four times a day (QID) | ORAL | Status: DC | PRN

## 2014-02-25 MED ORDER — ONDANSETRON HCL 4 MG PO TABS: 4.0000 mg | ORAL_TABLET | ORAL | Status: DC | PRN

## 2014-02-25 MED ORDER — SENNOSIDES-DOCUSATE SODIUM 8.6-50 MG PO TABS
2.0000 | ORAL_TABLET | ORAL | Status: DC
  Administered 2014-02-25 – 2014-02-26 (×2): 2 via ORAL
  Filled 2014-02-25 (×2): qty 2

## 2014-02-25 MED ORDER — OXYCODONE-ACETAMINOPHEN 5-325 MG PO TABS
1.0000 | ORAL_TABLET | ORAL | Status: DC | PRN
  Administered 2014-02-25 – 2014-02-27 (×4): 1 via ORAL
  Filled 2014-02-25 (×4): qty 1

## 2014-02-25 MED ORDER — OXYCODONE-ACETAMINOPHEN 5-325 MG PO TABS: 2.0000 | ORAL_TABLET | ORAL | Status: DC | PRN

## 2014-02-25 MED ORDER — PRENATAL MULTIVITAMIN CH
1.0000 | ORAL_TABLET | Freq: Every day | ORAL | Status: DC
  Administered 2014-02-25 – 2014-02-27 (×3): 1 via ORAL
  Filled 2014-02-25 (×3): qty 1

## 2014-02-25 MED ORDER — SIMETHICONE 80 MG PO CHEW: 80.0000 mg | CHEWABLE_TABLET | ORAL | Status: DC | PRN

## 2014-02-25 MED ORDER — TETANUS-DIPHTH-ACELL PERTUSSIS 5-2.5-18.5 LF-MCG/0.5 IM SUSP: 0.5000 mL | Freq: Once | INTRAMUSCULAR | Status: DC

## 2014-02-25 MED ORDER — ONDANSETRON HCL 4 MG/2ML IJ SOLN: 4.0000 mg | INTRAMUSCULAR | Status: DC | PRN

## 2014-02-25 MED ORDER — BENZOCAINE-MENTHOL 20-0.5 % EX AERO
1.0000 "application " | INHALATION_SPRAY | CUTANEOUS | Status: DC | PRN
  Administered 2014-02-26: 1 via TOPICAL
  Filled 2014-02-25: qty 56

## 2014-02-25 MED ORDER — IBUPROFEN 600 MG PO TABS
600.0000 mg | ORAL_TABLET | Freq: Four times a day (QID) | ORAL | Status: DC
  Administered 2014-02-25 – 2014-02-27 (×9): 600 mg via ORAL
  Filled 2014-02-25 (×9): qty 1

## 2014-02-25 MED ORDER — LANOLIN HYDROUS EX OINT: TOPICAL_OINTMENT | CUTANEOUS | Status: DC | PRN

## 2014-02-25 MED ORDER — WITCH HAZEL-GLYCERIN EX PADS: 1.0000 "application " | MEDICATED_PAD | CUTANEOUS | Status: DC | PRN

## 2014-02-25 MED ORDER — DIBUCAINE 1 % RE OINT: 1.0000 "application " | TOPICAL_OINTMENT | RECTAL | Status: DC | PRN

## 2014-02-25 NOTE — Progress Notes (Signed)
Followed FOB down the hall from room 101 towards 116.  He was weaving back and forth and running into the walls.  He and family members came to nurses station to request that his support band be transferred to another family member, the patient's sister, as he would not be staying.  I explained that L&D only gave the one support band per family but I could authorize with a form for the sister to be the support person and take the form to the security desk.  After much discussion this was agreed upon and support band cut from FOB and thrown away.    While at the nurses station you could smell alcohol from FOB, and he was leaning against the nurses station.  Family members walked back to room 116 and I got form and took it to get sister's name on it then took it to the security desk.

## 2014-02-25 NOTE — Lactation Note (Signed)
This note was copied from the chart of Christina Tymira Lanyon. Lactation Consultation Note  Initial visit done. Breastfeeding consultation services and support information given to patient. Mom has GDM and baby's blood sugar is low.  RN fed baby 5 mls of formula per 5 french feeding tube at breast and then baby became sleepy.  5 additional mls of formula given with curved tip syringe by finger feeding it to baby.  Baby didn't have a good seal on finger and not bringing tongue down well but did take all 5 mls.  A few small drops of colostrum expressed into baby's mouth.  Baby sleepy and placed skin to skin on mom's chest.  Will continue to assist mom with latch when baby cueing.    Patient Name: Christina Young Today's Date: 02/25/2014 Reason for consult: Initial assessment   Maternal Data Has patient been taught Hand Expression?: Yes Does the patient have breastfeeding experience prior to this delivery?: No  Feeding Feeding Type: Formula Length of feed: 10 min  LATCH Score/Interventions                      Lactation Tools Discussed/Used     Consult Status      Huston FoleyMOULDEN, Nuria Phebus S 02/25/2014, 1:23 PM

## 2014-02-26 LAB — CBC
HEMATOCRIT: 31.3 % — AB (ref 36.0–46.0)
HEMOGLOBIN: 10.8 g/dL — AB (ref 12.0–15.0)
MCH: 30.7 pg (ref 26.0–34.0)
MCHC: 34.5 g/dL (ref 30.0–36.0)
MCV: 88.9 fL (ref 78.0–100.0)
PLATELETS: 124 10*3/uL — AB (ref 150–400)
RBC: 3.52 MIL/uL — AB (ref 3.87–5.11)
RDW: 13.9 % (ref 11.5–15.5)
WBC: 13 10*3/uL — AB (ref 4.0–10.5)

## 2014-02-26 NOTE — Progress Notes (Signed)
Patient is eating, ambulating, voiding.  Pain control is good.  Filed Vitals:   02/25/14 1344 02/25/14 1720 02/25/14 2145 02/26/14 0546  BP: 118/50 116/66 124/50 116/59  Pulse: 67 72 77 65  Temp: 98.4 F (36.9 C) 97.9 F (36.6 C) 98.1 F (36.7 C) 97.4 F (36.3 C)  TempSrc: Oral Oral Oral Oral  Resp: 18 18 18 18   Height:      Weight:      SpO2:   100%     Fundus firm Perineum without swelling.  Lab Results  Component Value Date   WBC 13.0* 02/26/2014   HGB 10.8* 02/26/2014   HCT 31.3* 02/26/2014   MCV 88.9 02/26/2014   PLT 124* 02/26/2014    --/--/O POS, O POS (12/27 2045)  A/P Post partum day 1. Pt states baby is staying another day so she would like to as well.  Routine care.    Philip AspenALLAHAN, Shalaya Swailes

## 2014-02-26 NOTE — Lactation Note (Signed)
This note was copied from the chart of Christina Cristin Varricchio. Lactation Consultation Note Follow up visit at 36 hours of age.  Mom reports baby is doing better with bottle feeding and still trying breast, but she is sucking well when latching.  Mom has been pumping for about 15 minutes prior to latch attempts and bottle feeding formula supplementation.  Encouraged hand expression with pumping.  MOm is not collecting any colostrum yet.  Encouraged mom to also sit up allowing gravity to help with hand expression and pumping.  Mom to call for assist as needed.    Patient Name: Christina Cheryl FlashBritany Blansett AOZHY'QToday's Date: 02/26/2014 Reason for consult: Follow-up assessment;Difficult latch;Infant < 6lbs   Maternal Data    Feeding Feeding Type: Breast Fed Length of feed: 5 min  LATCH Score/Interventions                      Lactation Tools Discussed/Used     Consult Status Consult Status: Follow-up Date: 02/27/14 Follow-up type: In-patient    Jannifer RodneyShoptaw, Christina Lynn 02/26/2014, 6:14 PM

## 2014-02-27 LAB — GLUCOSE, CAPILLARY: Glucose-Capillary: 103 mg/dL — ABNORMAL HIGH (ref 70–99)

## 2014-02-27 NOTE — Discharge Summary (Signed)
Obstetric Discharge Summary Reason for Admission: induction of labor Prenatal Procedures: ultrasound Intrapartum Procedures: spontaneous vaginal delivery Postpartum Procedures: none Complications-Operative and Postpartum: none HEMOGLOBIN  Date Value Ref Range Status  02/26/2014 10.8* 12.0 - 15.0 g/dL Final   HCT  Date Value Ref Range Status  02/26/2014 31.3* 36.0 - 46.0 % Final    Physical Exam:  General: alert and cooperative Lochia: appropriate Uterine Fundus: firm DVT Evaluation: No evidence of DVT seen on physical exam.  Discharge Diagnoses: Term Pregnancy-delivered  Discharge Information: Date: 02/27/2014 Activity: pelvic rest Diet: routine Medications: PNV and Ibuprofen Condition: stable Instructions: refer to practice specific booklet Discharge to: home Follow-up Information    Follow up with Marlow BaarsLARK, DYANNA, MD In 4 weeks.   Specialty:  Obstetrics   Contact information:   [address] [place] [id number] [phone]       Newborn Data: Live born female  Birth Weight: 5 lb 8.7 oz (2515 g) APGAR: 8, 9  Home with mother.  Philip AspenCALLAHAN, Rathana Viveros 02/27/2014, 9:47 AM

## 2014-02-27 NOTE — Lactation Note (Signed)
This note was copied from the chart of Christina Young. Lactation Consultation Note  Patient Name: Christina Young Today's Date: 02/27/2014  Baby assisted to other breast to feed; baby latched & fed for 5 minutes, but then came off the breast, upset.  Baby bottle-fed in side-lying position with chin support.  Baby took 20 mL in 10 min (breaks provided).   Mom given d/c instructions  1. Offer breast first, then bottle (Mom knows how to apply & clean nipple shield.  Mom knows also how to use curved-tip syringe to prefill nipple shield.). Extra size 16 nipple shield provided.  2. If baby receiving bottle only, aim for 20-31m/feeding (although feed more if baby desires more). 3. Pump a few times/day, if possible.  Mom shown how to assemble & use hand pump that was included in pump kit.    Consult Status Consult Status: Complete    RMatthias HughsHOak Forest Hospital12/30/2015, 2:33 PM

## 2014-02-28 NOTE — Progress Notes (Signed)
Post discharge chart review completed.
